# Patient Record
Sex: Female | Born: 1980 | Race: White | Hispanic: No | Marital: Married | Smoking: Never smoker
Health system: Southern US, Community
[De-identification: ages and names within clinical notes are randomized; demographics above are authoritative.]

## PROBLEM LIST (undated history)

## (undated) DIAGNOSIS — I4891 Unspecified atrial fibrillation: Secondary | ICD-10-CM

## (undated) DIAGNOSIS — I499 Cardiac arrhythmia, unspecified: Secondary | ICD-10-CM

## (undated) DIAGNOSIS — E785 Hyperlipidemia, unspecified: Secondary | ICD-10-CM

## (undated) DIAGNOSIS — G4730 Sleep apnea, unspecified: Secondary | ICD-10-CM

## (undated) DIAGNOSIS — F419 Anxiety disorder, unspecified: Secondary | ICD-10-CM

## (undated) DIAGNOSIS — T8859XA Other complications of anesthesia, initial encounter: Secondary | ICD-10-CM

## (undated) DIAGNOSIS — Z87442 Personal history of urinary calculi: Secondary | ICD-10-CM

## (undated) DIAGNOSIS — K269 Duodenal ulcer, unspecified as acute or chronic, without hemorrhage or perforation: Secondary | ICD-10-CM

## (undated) DIAGNOSIS — IMO0001 Reserved for inherently not codable concepts without codable children: Secondary | ICD-10-CM

## (undated) DIAGNOSIS — T4145XA Adverse effect of unspecified anesthetic, initial encounter: Secondary | ICD-10-CM

## (undated) DIAGNOSIS — J45909 Unspecified asthma, uncomplicated: Secondary | ICD-10-CM

## (undated) DIAGNOSIS — K2981 Duodenitis with bleeding: Secondary | ICD-10-CM

## (undated) DIAGNOSIS — E039 Hypothyroidism, unspecified: Secondary | ICD-10-CM

---

## 2003-09-09 HISTORY — PX: ROTATOR CUFF REPAIR: SHX139

## 2005-09-08 HISTORY — PX: OTHER SURGICAL HISTORY: SHX169

## 2016-02-12 ENCOUNTER — Encounter (HOSPITAL_COMMUNITY): Payer: Self-pay | Admitting: Emergency Medicine

## 2016-02-12 ENCOUNTER — Emergency Department (HOSPITAL_COMMUNITY): Payer: BLUE CROSS/BLUE SHIELD

## 2016-02-12 DIAGNOSIS — R079 Chest pain, unspecified: Secondary | ICD-10-CM | POA: Insufficient documentation

## 2016-02-12 DIAGNOSIS — R110 Nausea: Secondary | ICD-10-CM | POA: Insufficient documentation

## 2016-02-12 DIAGNOSIS — Z5321 Procedure and treatment not carried out due to patient leaving prior to being seen by health care provider: Secondary | ICD-10-CM | POA: Diagnosis not present

## 2016-02-12 LAB — BASIC METABOLIC PANEL
ANION GAP: 11 (ref 5–15)
BUN: 11 mg/dL (ref 6–20)
CALCIUM: 9.2 mg/dL (ref 8.9–10.3)
CO2: 20 mmol/L — AB (ref 22–32)
CREATININE: 1.01 mg/dL — AB (ref 0.44–1.00)
Chloride: 106 mmol/L (ref 101–111)
Glucose, Bld: 118 mg/dL — ABNORMAL HIGH (ref 65–99)
Potassium: 4.3 mmol/L (ref 3.5–5.1)
SODIUM: 137 mmol/L (ref 135–145)

## 2016-02-12 LAB — CBC
HCT: 42.2 % (ref 36.0–46.0)
HEMOGLOBIN: 14.3 g/dL (ref 12.0–15.0)
MCH: 29.4 pg (ref 26.0–34.0)
MCHC: 33.9 g/dL (ref 30.0–36.0)
MCV: 86.7 fL (ref 78.0–100.0)
PLATELETS: 207 10*3/uL (ref 150–400)
RBC: 4.87 MIL/uL (ref 3.87–5.11)
RDW: 13.3 % (ref 11.5–15.5)
WBC: 10.7 10*3/uL — AB (ref 4.0–10.5)

## 2016-02-12 LAB — I-STAT TROPONIN, ED: TROPONIN I, POC: 0 ng/mL (ref 0.00–0.08)

## 2016-02-12 NOTE — ED Notes (Signed)
The patient said she has been having ches pain since thins morning.  The patient says the pain radiates to the right side of her back.  She says she is also nauseated but not vomiting.  She denies diarrhea.   She rates her pain 9/10.

## 2016-02-13 ENCOUNTER — Emergency Department (HOSPITAL_COMMUNITY)
Admission: EM | Admit: 2016-02-13 | Discharge: 2016-02-13 | Disposition: A | Discharge status: Home / Self Care | Payer: BLUE CROSS/BLUE SHIELD | Attending: Dermatology | Admitting: Dermatology

## 2016-02-13 HISTORY — DX: Hyperlipidemia, unspecified: E78.5

## 2016-02-13 HISTORY — DX: Duodenitis with bleeding: K29.81

## 2016-02-13 HISTORY — DX: Unspecified atrial fibrillation: I48.91

## 2016-02-13 HISTORY — DX: Duodenal ulcer, unspecified as acute or chronic, without hemorrhage or perforation: K26.9

## 2016-02-13 HISTORY — DX: Unspecified asthma, uncomplicated: J45.909

## 2016-02-13 HISTORY — DX: Hypothyroidism, unspecified: E03.9

## 2016-02-13 NOTE — ED Notes (Signed)
Pt called for room, no answer.

## 2016-02-15 ENCOUNTER — Other Ambulatory Visit: Payer: Self-pay | Admitting: Otolaryngology

## 2016-02-15 DIAGNOSIS — J329 Chronic sinusitis, unspecified: Secondary | ICD-10-CM

## 2016-02-21 ENCOUNTER — Ambulatory Visit
Admission: RE | Admit: 2016-02-21 | Discharge: 2016-02-21 | Disposition: A | Discharge status: Home / Self Care | Payer: BLUE CROSS/BLUE SHIELD | Source: Ambulatory Visit | Attending: Otolaryngology | Admitting: Otolaryngology

## 2016-02-21 DIAGNOSIS — J329 Chronic sinusitis, unspecified: Secondary | ICD-10-CM

## 2016-02-26 ENCOUNTER — Other Ambulatory Visit: Payer: Self-pay | Admitting: Otolaryngology

## 2016-02-26 NOTE — H&P (Signed)
PREOPERATIVE H&P  Chief Complaint: OSA and nasal obstruction  HPI: Sara [name] is a 35 y.o. female who presents for evaluation of OSA and nasal obstruction. She's had a sleep test which OSA with RDI of 28. Lowest O2 sat to 82%. She doesn't tolerate wearing nasal CPAP which she has tried. On exam she has large tonsils and deviated septum and large turbinates. She would benefit from UPPP , septoplasty and TR. She's taken to the OR for the above procedures.  Past Medical History  Diagnosis Date  . Hyperlipidemia   . A-fib (HCC)   . Hypothyroidism   . Kidney stones   . Asthma   . Multiple duodenal ulcers    Past Surgical History  Procedure Laterality Date  . Kidney stone removal    . Rotator cuff repair     Social History   Social History  . Marital Status: Married    Spouse Name: N/A  . Number of Children: N/A  . Years of Education: N/A   Social History Main Topics  . Smoking status: Never Smoker   . Smokeless tobacco: Never Used  . Alcohol Use: No  . Drug Use: No  . Sexual Activity: Not on file   Other Topics Concern  . Not on file   Social History Narrative  . No narrative on file   No family history on file. Allergies  Allergen Reactions  . Albuterol Other (See Comments)    Tachycardia   . Codeine Rash  . Penicillins Rash   Prior to Admission medications   Medication Sig Start Date End Date Taking? Authorizing Provider  alprazolam Prudy Feeler) 2 MG tablet Take 2 mg by mouth 3 (three) times daily as needed for anxiety.   Yes Historical Provider, MD  atorvastatin (LIPITOR) 10 MG tablet Take 10 mg by mouth daily.   Yes Historical Provider, MD  Cyanocobalamin (B-12) 5000 MCG SUBL Place 1 tablet under the tongue daily.   Yes Historical Provider, MD  fluticasone (FLONASE) 50 MCG/ACT nasal spray Place 1 spray into both nostrils daily.   Yes Historical Provider, MD  levalbuterol (XOPENEX HFA) 45 MCG/ACT inhaler Inhale 1 puff into the lungs every 4 (four) hours as needed  for wheezing.   Yes Historical Provider, MD  levothyroxine (SYNTHROID, LEVOTHROID) 200 MCG tablet Take 200 mcg by mouth daily before breakfast. Pt takes 250 mcg daily   Yes Historical Provider, MD  levothyroxine (SYNTHROID, LEVOTHROID) 50 MCG tablet Take 50 mcg by mouth daily before breakfast. Pt takes 250 mcg daily   Yes Historical Provider, MD  loratadine (CLARITIN) 10 MG tablet Take 10 mg by mouth daily.   Yes Historical Provider, MD  pantoprazole (PROTONIX) 40 MG tablet Take 40 mg by mouth 2 (two) times daily.   Yes Historical Provider, MD  propranolol (INDERAL) 40 MG tablet Take 40 mg by mouth 2 (two) times daily.   Yes Historical Provider, MD  Vitamin D, Ergocalciferol, (DRISDOL) 50000 units CAPS capsule Take 50,000 Units by mouth every Monday.   Yes Historical Provider, MD     Positive ROS: per HPI  All other systems have been reviewed and were otherwise negative with the exception of those mentioned in the HPI and as above.  Physical Exam: There were no vitals filed for this visit.  General: Alert, no acute distress Oral: Normal oral mucosa and 3+ tonsils with long uvula Nasal: Clear nasal passages. Septal deviation and large turbinates Neck: No palpable adenopathy or thyroid nodules Ear: Ear canal is clear  with normal appearing TMs Cardiovascular: Regular rate and rhythm, no murmur.  Respiratory: Clear to auscultation Neurologic: Alert and oriented x 3   Assessment/Plan: NASAL OBSTRUCTION,OBSTRUCTIVE SLEEP APNEA Plan for Procedure(s): UVULOPALATOPHARYNGOPLASTY (UPPP) BILATERAL TONSILLECTOMY/SEPTOPLASTY WITH TURBINATE REDUCTION   Dillard CannonHRISTOPHER NEWMAN, MD 02/26/2016 5:43 PM

## 2016-02-27 ENCOUNTER — Encounter (HOSPITAL_COMMUNITY)
Admission: RE | Admit: 2016-02-27 | Discharge: 2016-02-27 | Disposition: A | Discharge status: Home / Self Care | Payer: BLUE CROSS/BLUE SHIELD | Source: Ambulatory Visit | Attending: Otolaryngology | Admitting: Otolaryngology

## 2016-02-27 ENCOUNTER — Encounter (HOSPITAL_COMMUNITY): Payer: Self-pay

## 2016-02-27 DIAGNOSIS — E785 Hyperlipidemia, unspecified: Secondary | ICD-10-CM | POA: Diagnosis not present

## 2016-02-27 DIAGNOSIS — J3489 Other specified disorders of nose and nasal sinuses: Secondary | ICD-10-CM | POA: Diagnosis present

## 2016-02-27 DIAGNOSIS — G4733 Obstructive sleep apnea (adult) (pediatric): Secondary | ICD-10-CM | POA: Diagnosis not present

## 2016-02-27 DIAGNOSIS — I4891 Unspecified atrial fibrillation: Secondary | ICD-10-CM | POA: Diagnosis not present

## 2016-02-27 DIAGNOSIS — E039 Hypothyroidism, unspecified: Secondary | ICD-10-CM | POA: Diagnosis not present

## 2016-02-27 DIAGNOSIS — Z880 Allergy status to penicillin: Secondary | ICD-10-CM | POA: Diagnosis not present

## 2016-02-27 HISTORY — DX: Reserved for inherently not codable concepts without codable children: IMO0001

## 2016-02-27 HISTORY — DX: Adverse effect of unspecified anesthetic, initial encounter: T41.45XA

## 2016-02-27 HISTORY — DX: Sleep apnea, unspecified: G47.30

## 2016-02-27 HISTORY — DX: Personal history of urinary calculi: Z87.442

## 2016-02-27 HISTORY — DX: Other complications of anesthesia, initial encounter: T88.59XA

## 2016-02-27 HISTORY — DX: Cardiac arrhythmia, unspecified: I49.9

## 2016-02-27 LAB — BASIC METABOLIC PANEL
Anion gap: 8 (ref 5–15)
BUN: 13 mg/dL (ref 6–20)
CHLORIDE: 107 mmol/L (ref 101–111)
CO2: 23 mmol/L (ref 22–32)
CREATININE: 0.85 mg/dL (ref 0.44–1.00)
Calcium: 9.6 mg/dL (ref 8.9–10.3)
GFR calc Af Amer: >60 mL/min (ref >60)
GFR calc non Af Amer: >60 mL/min (ref >60)
Glucose, Bld: 83 mg/dL (ref 65–99)
Potassium: 4.1 mmol/L (ref 3.5–5.1)
Sodium: 138 mmol/L (ref 135–145)

## 2016-02-27 LAB — CBC
HCT: 44.7 % (ref 36.0–46.0)
Hemoglobin: 14.9 g/dL (ref 12.0–15.0)
MCH: 29.3 pg (ref 26.0–34.0)
MCHC: 33.3 g/dL (ref 30.0–36.0)
MCV: 87.8 fL (ref 78.0–100.0)
PLATELETS: 258 10*3/uL (ref 150–400)
RBC: 5.09 MIL/uL (ref 3.87–5.11)
RDW: 13.2 % (ref 11.5–15.5)
WBC: 10.6 10*3/uL — ABNORMAL HIGH (ref 4.0–10.5)

## 2016-02-27 LAB — HCG, SERUM, QUALITATIVE: Preg, Serum: NEGATIVE

## 2016-02-27 NOTE — Progress Notes (Addendum)
Anesthesia Chart Review: Patient is a 35 year old female scheduled for UPPP, bilateral tonsillectomy, septoplasty with turbinate reduction on 02/29/16 by Dr. Dillard Cannonhristopher Newman.  History includes non-smoker, asthma, hypothyroidism, HLD, HTN, dysrhythmia (tachycardia; albuterol induced afib/SVT), OSA (no CPAP; unable to tolerate), nephrolithiasis s/p stone retrieval '07.  She reported tachycardia after anesthesia/surgery and awareness during rotator cuff and lithotripsy procedures. BMI is consistent with morbid obesity. PCP is Dr. Christell FaithJon Bullman in [place], [place].  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 She reports two instances in which she went into "afib" after receiving multiple consecutive doses of albuterol during an asthma exacerbation. The first time adenosine was unsuccessful, and she was started on a Cardizem drip and converted to SR the next day. She was evaluated by a cardiologist and reportedly had an unremarkable work-up. She thinks this was at [facility] in [place] approximately 3 years ago. She had recurrent "afib" after albuterol that did respond to adenosine. She now uses Xopenox and has not had any issues with it. Since believes she had a more recent echo within the past year at either [facility]. Dem[facility]rius [facility]P. [facility] in [place] or [facility] in [place], [place]. [facility] did not have record of a stress or echo. [facility] did have an echo and telemetry/event monitor strips). Records from [facility] requested this morning, but are still pending.    Meds include Xanax, Lipitor, Flonase, Xopenex, levothyroxine, Claritin, Protonix, Inderal, Vitamins D and B12. She was instructed to take Inderal on the day of surgery.  PAT Vitals: BP 109/48, HR 76, RR 20, T 36.4C, O2 sat 98%.  02/12/16 EKG: ST at 114 bpm, rightward axis.    09/03/14 Echo [facility]): Impression:  1. Normal left intracavity  dimension. Borderline LVH. EF > 70% with a ventricle that is noted to be hyperdynamic. 2. RV is normal size with hyperdynamic RVSP. 3. Normal LA and aortic root dimension. 5. There are no overt intracardiac masses, thrombi or vegetation or hemodynamically significant pericardial effusion.  (Telemetry/event monitor then showed SR with HR 59-132 bpm.)  06/26/15 BLE venous Duplex [facility] [facility]): Negative for bilateral DVT.  She has a previous history of what sounds like albuterol-induced SVT with reported normal work-up about three years ago. We were able to get a fairly recent echo that was unremarkable. She was not tachycardiac at PAT, but due to her history she was instructed to take Inderal on the morning of surgery. If no acute changes then I would anticipate that she could proceed as planned.    Sara Ochsllison Kanin Lia, PA-C [facility] Short Stay Center/Anesthesiology Phone [phone] 02/28/2016 5:22 PM

## 2016-02-27 NOTE — Pre-Procedure Instructions (Addendum)
Sara [name]  02/27/2016      [facility] [id number] Ginette Otto- [place], [place] - [address] AT [facility] [address] [facility] [address] [place] [id number] Phone: [phone] Fax: [phone]    Your procedure is scheduled on 02/29/16  [facility] Admitting at 530 A.M.  Call this number if you have problems the morning of surgery:  [phone]   Remember:  Do not eat food or drink liquids after midnight.  Take these medicines the morning of surgery with A SIP OF WATER inhaler if needed(bring) xanax if needed, levothyroxine, protonix, propanolol(inderal)  STOP all herbel meds, nsaids (aleve,naproxen,advil,ibuprofen)   including all vitamins, aspirin starting now   Do not wear jewelry, make-up or nail polish.  Do not wear lotions, powders, or perfumes.  You may wear deoderant.  Do not shave 48 hours prior to surgery.  Men may shave face and neck.  Do not bring valuables to the hospital.  [facility] is not responsible for any belongings or valuables.  Contacts, dentures or bridgework may not be worn into surgery.  Leave your suitcase in the car.  After surgery it may be brought to your room.  For patients admitted to the hospital, discharge time will be determined by your treatment team.  Patients discharged the day of surgery will not be allowed to drive home.   Name and phone number of your driver:    Special instructions:   Special Instructions: [facility] - Preparing for Surgery  Before surgery, you can play an important role.  Because skin is not sterile, your skin needs to be as free of germs as possible.  You can reduce the number of germs on you skin by washing with CHG (chlorahexidine gluconate) soap before surgery.  CHG is an antiseptic cleaner which kills germs and bonds with the skin to continue killing germs even after washing.  Please DO NOT use if you have an allergy to CHG or antibacterial soaps.  If your skin becomes  reddened/irritated stop using the CHG and inform your nurse when you arrive at Short Stay.  Do not shave (including legs and underarms) for at least 48 hours prior to the first CHG shower.  You may shave your face.  Please follow these instructions carefully:   1.  Shower with CHG Soap the night before surgery and the morning of Surgery.  2.  If you choose to wash your hair, wash your hair first as usual with your normal shampoo.  3.  After you shampoo, rinse your hair and body thoroughly to remove the Shampoo.  4.  Use CHG as you would any other liquid soap.  You can apply chg directly  to the skin and wash gently with scrungie or a clean washcloth.  5.  Apply the CHG Soap to your body ONLY FROM THE NECK DOWN.  Do not use on open wounds or open sores.  Avoid contact with your eyes ears, mouth and genitals (private parts).  Wash genitals (private parts)       with your normal soap.  6.  Wash thoroughly, paying special attention to the area where your surgery will be performed.  7.  Thoroughly rinse your body with warm water from the neck down.  8.  DO NOT shower/wash with your normal soap after using and rinsing off the CHG Soap.  9.  Pat yourself dry with a clean towel.  10.  Wear clean pajamas.            11.  Place clean sheets on your bed the night of your first shower and do not sleep with pets.  Day of Surgery  Do not apply any lotions/deodorants the morning of surgery.  Please wear clean clothes to the hospital/surgery center.

## 2016-02-28 ENCOUNTER — Encounter (HOSPITAL_COMMUNITY): Payer: Self-pay

## 2016-02-28 MED ORDER — CEFAZOLIN SODIUM-DEXTROSE 2-4 GM/100ML-% IV SOLN
2.0000 g | INTRAVENOUS | Status: DC
  Filled 2016-02-28: qty 100

## 2016-02-28 NOTE — Anesthesia Preprocedure Evaluation (Addendum)
Anesthesia Evaluation  Patient identified by MRN, date of birth, ID band Patient awake    Reviewed: Allergy & Precautions, NPO status , Patient's Chart, lab work & pertinent test results  Airway Mallampati: II  TM Distance: >3 FB Neck ROM: Full    Dental no notable dental hx.    Pulmonary shortness of breath, asthma , sleep apnea ,    Pulmonary exam normal breath sounds clear to auscultation       Cardiovascular Normal cardiovascular exam+ dysrhythmias Atrial Fibrillation  Rhythm:Regular Rate:Normal     Neuro/Psych negative neurological ROS  negative psych ROS   GI/Hepatic negative GI ROS, Neg liver ROS,   Endo/Other  Hypothyroidism Morbid obesity  Renal/GU negative Renal ROS     Musculoskeletal negative musculoskeletal ROS (+)   Abdominal (+) + obese,   Peds  Hematology negative hematology ROS (+)   Anesthesia Other Findings   Reproductive/Obstetrics negative OB ROS                            Anesthesia Physical Anesthesia Plan  ASA: III  Anesthesia Plan: General   Post-op Pain Management:    Induction: Intravenous  Airway Management Planned: Oral ETT  Additional Equipment:   Intra-op Plan:   Post-operative Plan: Extubation in OR  Informed Consent: I have reviewed the patients History and Physical, chart, labs and discussed the procedure including the risks, benefits and alternatives for the proposed anesthesia with the patient or authorized representative who has indicated his/her understanding and acceptance.   Dental advisory given  Plan Discussed with: CRNA  Anesthesia Plan Comments:         Anesthesia Quick Evaluation

## 2016-02-29 ENCOUNTER — Observation Stay (HOSPITAL_COMMUNITY)
Admission: RE | Admit: 2016-02-29 | Discharge: 2016-03-01 | Disposition: A | Discharge status: Home / Self Care | Payer: BLUE CROSS/BLUE SHIELD | Source: Ambulatory Visit | Attending: Otolaryngology | Admitting: Otolaryngology

## 2016-02-29 ENCOUNTER — Encounter (HOSPITAL_COMMUNITY): Payer: Self-pay | Admitting: General Practice

## 2016-02-29 ENCOUNTER — Ambulatory Visit (HOSPITAL_COMMUNITY): Payer: BLUE CROSS/BLUE SHIELD | Admitting: Certified Registered Nurse Anesthetist

## 2016-02-29 ENCOUNTER — Ambulatory Visit (HOSPITAL_COMMUNITY): Payer: BLUE CROSS/BLUE SHIELD | Admitting: Vascular Surgery

## 2016-02-29 ENCOUNTER — Encounter (HOSPITAL_COMMUNITY)
Admission: RE | Disposition: A | Discharge status: Home / Self Care | Payer: Self-pay | Source: Ambulatory Visit | Attending: Otolaryngology

## 2016-02-29 DIAGNOSIS — E039 Hypothyroidism, unspecified: Secondary | ICD-10-CM | POA: Insufficient documentation

## 2016-02-29 DIAGNOSIS — Z880 Allergy status to penicillin: Secondary | ICD-10-CM | POA: Insufficient documentation

## 2016-02-29 DIAGNOSIS — G4733 Obstructive sleep apnea (adult) (pediatric): Secondary | ICD-10-CM | POA: Diagnosis present

## 2016-02-29 DIAGNOSIS — E785 Hyperlipidemia, unspecified: Secondary | ICD-10-CM | POA: Insufficient documentation

## 2016-02-29 DIAGNOSIS — J3489 Other specified disorders of nose and nasal sinuses: Principal | ICD-10-CM | POA: Insufficient documentation

## 2016-02-29 DIAGNOSIS — I4891 Unspecified atrial fibrillation: Secondary | ICD-10-CM | POA: Insufficient documentation

## 2016-02-29 HISTORY — PX: UVULECTOMY: SHX2631

## 2016-02-29 HISTORY — DX: Anxiety disorder, unspecified: F41.9

## 2016-02-29 HISTORY — PX: UVULOPALATOPHARYNGOPLASTY (UPPP)/TONSILLECTOMY/SEPTOPLASTY: SHX6164

## 2016-02-29 SURGERY — SEPTOPLASTY, NOSE, WITH TONSILLECTOMY AND UPPP
Anesthesia: General | Site: Mouth | Laterality: Bilateral

## 2016-02-29 SURGERY — Surgical Case
Anesthesia: *Unknown

## 2016-02-29 MED ORDER — HYDROMORPHONE HCL 1 MG/ML IJ SOLN
0.2500 mg | INTRAMUSCULAR | Status: DC | PRN
  Administered 2016-02-29 (×4): 0.5 mg via INTRAVENOUS

## 2016-02-29 MED ORDER — OXYMETAZOLINE HCL 0.05 % NA SOLN
NASAL | Status: AC
  Filled 2016-02-29: qty 15

## 2016-02-29 MED ORDER — SCOPOLAMINE 1 MG/3DAYS TD PT72
MEDICATED_PATCH | TRANSDERMAL | Status: AC
  Filled 2016-02-29: qty 1

## 2016-02-29 MED ORDER — ROCURONIUM BROMIDE 50 MG/5ML IV SOLN
INTRAVENOUS | Status: AC
  Filled 2016-02-29: qty 1

## 2016-02-29 MED ORDER — MIDAZOLAM HCL 5 MG/5ML IJ SOLN
INTRAMUSCULAR | Status: DC | PRN
  Administered 2016-02-29: 2 mg via INTRAVENOUS

## 2016-02-29 MED ORDER — LEVOTHYROXINE SODIUM 100 MCG PO TABS: 200.0000 ug | ORAL_TABLET | Freq: Every day | ORAL | Status: DC

## 2016-02-29 MED ORDER — HYDROMORPHONE HCL 1 MG/ML IJ SOLN
1.0000 mg | INTRAMUSCULAR | Status: DC | PRN
  Administered 2016-02-29 – 2016-03-01 (×2): 1 mg via INTRAVENOUS
  Filled 2016-02-29 (×3): qty 1

## 2016-02-29 MED ORDER — LEVOTHYROXINE SODIUM 50 MCG PO TABS: 50.0000 ug | ORAL_TABLET | Freq: Every day | ORAL | Status: DC

## 2016-02-29 MED ORDER — HYDROCODONE-ACETAMINOPHEN 7.5-325 MG/15ML PO SOLN
10.0000 mL | ORAL | Status: DC | PRN
  Administered 2016-02-29 – 2016-03-01 (×3): 15 mL via ORAL
  Filled 2016-02-29 (×6): qty 15

## 2016-02-29 MED ORDER — DEXAMETHASONE SODIUM PHOSPHATE 10 MG/ML IJ SOLN
INTRAMUSCULAR | Status: DC | PRN
  Administered 2016-02-29: 10 mg via INTRAVENOUS

## 2016-02-29 MED ORDER — FENTANYL CITRATE (PF) 250 MCG/5ML IJ SOLN
INTRAMUSCULAR | Status: AC
  Filled 2016-02-29: qty 5

## 2016-02-29 MED ORDER — LEVALBUTEROL TARTRATE 45 MCG/ACT IN AERO
1.0000 | INHALATION_SPRAY | RESPIRATORY_TRACT | Status: DC
  Filled 2016-02-29: qty 15

## 2016-02-29 MED ORDER — PROPOFOL 10 MG/ML IV BOLUS
INTRAVENOUS | Status: AC
  Filled 2016-02-29: qty 20

## 2016-02-29 MED ORDER — BACITRACIN ZINC 500 UNIT/GM EX OINT
TOPICAL_OINTMENT | CUTANEOUS | Status: AC
  Filled 2016-02-29: qty 28.35

## 2016-02-29 MED ORDER — ROCURONIUM BROMIDE 100 MG/10ML IV SOLN
INTRAVENOUS | Status: DC | PRN
  Administered 2016-02-29 (×2): 10 mg via INTRAVENOUS
  Administered 2016-02-29: 40 mg via INTRAVENOUS

## 2016-02-29 MED ORDER — MIDAZOLAM HCL 2 MG/2ML IJ SOLN
INTRAMUSCULAR | Status: AC
  Filled 2016-02-29: qty 2

## 2016-02-29 MED ORDER — PHENOL 1.4 % MT LIQD
1.0000 | OROMUCOSAL | Status: DC | PRN
Start: 2016-02-29 — End: 2016-03-01
  Administered 2016-02-29: 1 via OROMUCOSAL
  Filled 2016-02-29 (×2): qty 177

## 2016-02-29 MED ORDER — ALPRAZOLAM 0.5 MG PO TABS
2.0000 mg | ORAL_TABLET | Freq: Three times a day (TID) | ORAL | Status: DC | PRN
Start: 2016-02-29 — End: 2016-03-01
  Administered 2016-03-01: 2 mg via ORAL
  Filled 2016-02-29: qty 4

## 2016-02-29 MED ORDER — ONDANSETRON HCL 4 MG/2ML IJ SOLN
4.0000 mg | Freq: Four times a day (QID) | INTRAMUSCULAR | Status: DC | PRN
  Administered 2016-02-29 – 2016-03-01 (×3): 4 mg via INTRAVENOUS
  Filled 2016-02-29 (×4): qty 2

## 2016-02-29 MED ORDER — GLYCOPYRROLATE 0.2 MG/ML IJ SOLN
INTRAMUSCULAR | Status: DC | PRN
  Administered 2016-02-29: 0.2 mg via INTRAVENOUS

## 2016-02-29 MED ORDER — MEPERIDINE HCL 25 MG/ML IJ SOLN: 6.2500 mg | INTRAMUSCULAR | Status: DC | PRN

## 2016-02-29 MED ORDER — OXYCODONE HCL 5 MG/5ML PO SOLN
5.0000 mg | ORAL | Status: DC | PRN
  Administered 2016-02-29: 5 mg via ORAL
  Filled 2016-02-29: qty 5

## 2016-02-29 MED ORDER — GLYCOPYRROLATE 0.2 MG/ML IV SOSY
PREFILLED_SYRINGE | INTRAVENOUS | Status: AC
  Filled 2016-02-29: qty 3

## 2016-02-29 MED ORDER — CEFAZOLIN IN D5W 1 GM/50ML IV SOLN
1.0000 g | Freq: Three times a day (TID) | INTRAVENOUS | Status: DC
  Administered 2016-02-29 – 2016-03-01 (×3): 1 g via INTRAVENOUS
  Filled 2016-02-29 (×4): qty 50

## 2016-02-29 MED ORDER — ONDANSETRON HCL 4 MG/2ML IJ SOLN
INTRAMUSCULAR | Status: DC | PRN
  Administered 2016-02-29: 4 mg via INTRAVENOUS

## 2016-02-29 MED ORDER — ONDANSETRON HCL 4 MG/2ML IJ SOLN: 4.0000 mg | Freq: Four times a day (QID) | INTRAMUSCULAR | Status: DC

## 2016-02-29 MED ORDER — PROPRANOLOL HCL 40 MG PO TABS
40.0000 mg | ORAL_TABLET | Freq: Two times a day (BID) | ORAL | Status: DC
  Filled 2016-02-29 (×3): qty 1

## 2016-02-29 MED ORDER — LIDOCAINE-EPINEPHRINE 1 %-1:100000 IJ SOLN
INTRAMUSCULAR | Status: AC
  Filled 2016-02-29: qty 1

## 2016-02-29 MED ORDER — FENTANYL CITRATE (PF) 100 MCG/2ML IJ SOLN
INTRAMUSCULAR | Status: DC | PRN
  Administered 2016-02-29 (×5): 50 ug via INTRAVENOUS

## 2016-02-29 MED ORDER — LIDOCAINE 2% (20 MG/ML) 5 ML SYRINGE
INTRAMUSCULAR | Status: AC
  Filled 2016-02-29: qty 5

## 2016-02-29 MED ORDER — IBUPROFEN 100 MG/5ML PO SUSP
400.0000 mg | Freq: Four times a day (QID) | ORAL | Status: DC | PRN
  Filled 2016-02-29: qty 20

## 2016-02-29 MED ORDER — HYDROMORPHONE HCL 1 MG/ML IJ SOLN
INTRAMUSCULAR | Status: AC
  Filled 2016-02-29: qty 1

## 2016-02-29 MED ORDER — LEVALBUTEROL HCL 0.63 MG/3ML IN NEBU: 0.6300 mg | INHALATION_SOLUTION | RESPIRATORY_TRACT | Status: DC | PRN

## 2016-02-29 MED ORDER — LIDOCAINE HCL (CARDIAC) 20 MG/ML IV SOLN
INTRAVENOUS | Status: DC | PRN
  Administered 2016-02-29: 100 mg via INTRAVENOUS

## 2016-02-29 MED ORDER — LIDOCAINE-EPINEPHRINE 1 %-1:100000 IJ SOLN
INTRAMUSCULAR | Status: DC | PRN
  Administered 2016-02-29: 20 mL

## 2016-02-29 MED ORDER — CEFAZOLIN SODIUM-DEXTROSE 2-3 GM-% IV SOLR
INTRAVENOUS | Status: DC | PRN
  Administered 2016-02-29: 2 g via INTRAVENOUS

## 2016-02-29 MED ORDER — SCOPOLAMINE 1 MG/3DAYS TD PT72
1.0000 | MEDICATED_PATCH | TRANSDERMAL | Status: DC
  Administered 2016-02-29: 1.5 mg via TRANSDERMAL

## 2016-02-29 MED ORDER — PROMETHAZINE HCL 25 MG/ML IJ SOLN
INTRAMUSCULAR | Status: AC
  Filled 2016-02-29: qty 1

## 2016-02-29 MED ORDER — HYDROCODONE-ACETAMINOPHEN 7.5-325 MG/15ML PO SOLN
ORAL | Status: AC
  Filled 2016-02-29: qty 15

## 2016-02-29 MED ORDER — PROMETHAZINE HCL 25 MG/ML IJ SOLN
6.2500 mg | INTRAMUSCULAR | Status: AC | PRN
Start: 2016-02-29 — End: 2016-02-29
  Administered 2016-02-29: 12.5 mg via INTRAVENOUS
  Administered 2016-02-29: 6.25 mg via INTRAVENOUS

## 2016-02-29 MED ORDER — OXYMETAZOLINE HCL 0.05 % NA SOLN
NASAL | Status: DC | PRN
  Administered 2016-02-29: 1 via TOPICAL

## 2016-02-29 MED ORDER — SUGAMMADEX SODIUM 500 MG/5ML IV SOLN
INTRAVENOUS | Status: DC | PRN
  Administered 2016-02-29: 200 mg via INTRAVENOUS

## 2016-02-29 MED ORDER — PROPOFOL 10 MG/ML IV BOLUS
INTRAVENOUS | Status: DC | PRN
  Administered 2016-02-29: 200 mg via INTRAVENOUS

## 2016-02-29 MED ORDER — LACTATED RINGERS IV SOLN
INTRAVENOUS | Status: DC | PRN
  Administered 2016-02-29 (×2): via INTRAVENOUS

## 2016-02-29 MED ORDER — MORPHINE SULFATE (PF) 2 MG/ML IV SOLN
INTRAVENOUS | Status: AC
  Filled 2016-02-29: qty 1

## 2016-02-29 MED ORDER — MORPHINE SULFATE (PF) 2 MG/ML IV SOLN
2.0000 mg | INTRAVENOUS | Status: DC | PRN
  Administered 2016-02-29 (×3): 2 mg via INTRAVENOUS
  Filled 2016-02-29 (×2): qty 1

## 2016-02-29 SURGICAL SUPPLY — 54 items
BLADE 10 SAFETY STRL DISP (BLADE) IMPLANT
BLADE INF TURB ROT M4 2 5PK (BLADE) ×2 IMPLANT
BLADE INF TURB ROT M4 2MM 5PK (BLADE) ×1
BLADE SURG 15 STRL LF DISP TIS (BLADE) IMPLANT
BLADE SURG 15 STRL SS (BLADE)
CANISTER SUCTION 2500CC (MISCELLANEOUS) ×3 IMPLANT
CLEANER TIP ELECTROSURG 2X2 (MISCELLANEOUS) ×3 IMPLANT
CLOSURE WOUND 1/2 X4 (GAUZE/BANDAGES/DRESSINGS)
COAGULATOR SUCT 8FR VV (MISCELLANEOUS) ×3 IMPLANT
DRAPE PROXIMA HALF (DRAPES) ×3 IMPLANT
DRESSING NASAL KENNEDY 3.5X.9 (MISCELLANEOUS) IMPLANT
DRESSING TELFA 8X3 (GAUZE/BANDAGES/DRESSINGS) IMPLANT
DRSG NASAL KENNEDY 3.5X.9 (MISCELLANEOUS)
DRSG TELFA 3X8 NADH (GAUZE/BANDAGES/DRESSINGS) ×3 IMPLANT
ELECT COATED BLADE 2.86 ST (ELECTRODE) ×3 IMPLANT
ELECT REM PT RETURN 9FT ADLT (ELECTROSURGICAL) ×3
ELECTRODE REM PT RTRN 9FT ADLT (ELECTROSURGICAL) ×1 IMPLANT
GLOVE BIOGEL PI IND STRL 7.0 (GLOVE) ×1 IMPLANT
GLOVE BIOGEL PI INDICATOR 7.0 (GLOVE) ×2
GLOVE SS BIOGEL STRL SZ 7.5 (GLOVE) ×1 IMPLANT
GLOVE SUPERSENSE BIOGEL SZ 7.5 (GLOVE) ×2
GLOVE SURG SS PI 7.0 STRL IVOR (GLOVE) ×3 IMPLANT
GOWN STRL REUS W/ TWL LRG LVL3 (GOWN DISPOSABLE) ×2 IMPLANT
GOWN STRL REUS W/ TWL XL LVL3 (GOWN DISPOSABLE) ×1 IMPLANT
GOWN STRL REUS W/TWL LRG LVL3 (GOWN DISPOSABLE) ×4
GOWN STRL REUS W/TWL XL LVL3 (GOWN DISPOSABLE) ×2
KIT BASIN OR (CUSTOM PROCEDURE TRAY) ×3 IMPLANT
KIT ROOM TURNOVER OR (KITS) ×3 IMPLANT
NEEDLE HYPO 25GX1X1/2 BEV (NEEDLE) ×3 IMPLANT
NEEDLE HYPO 25X1 1.5 SAFETY (NEEDLE) IMPLANT
NS IRRIG 1000ML POUR BTL (IV SOLUTION) ×3 IMPLANT
PAD ARMBOARD 7.5X6 YLW CONV (MISCELLANEOUS) ×6 IMPLANT
PATTIES SURGICAL .5 X3 (DISPOSABLE) ×3 IMPLANT
PENCIL FOOT CONTROL (ELECTRODE) ×3 IMPLANT
SPECIMEN JAR SMALL (MISCELLANEOUS) ×3 IMPLANT
SPLINT NASAL DOYLE BI-VL (GAUZE/BANDAGES/DRESSINGS) IMPLANT
SPONGE INTESTINAL PEANUT (DISPOSABLE) ×3 IMPLANT
SPONGE TONSIL 1 RF SGL (DISPOSABLE) IMPLANT
STRIP CLOSURE SKIN 1/2X4 (GAUZE/BANDAGES/DRESSINGS) IMPLANT
SUT CHROMIC 3 0 PS 2 (SUTURE) ×3 IMPLANT
SUT CHROMIC 4 0 PS 5 (SUTURE) ×3 IMPLANT
SUT CHROMIC 5 0 P 3 (SUTURE) IMPLANT
SUT ETHILON 3 0 PS 1 (SUTURE) IMPLANT
SUT ETHILON 4 0 PS 2 18 (SUTURE) IMPLANT
SUT ETHILON 5 0 P 3 18 (SUTURE)
SUT NYLON ETHILON 5-0 P-3 1X18 (SUTURE) IMPLANT
SUT SILK 2 0 FS (SUTURE) ×3 IMPLANT
SUT VIC AB 3-0 SH 27 (SUTURE)
SUT VIC AB 3-0 SH 27XBRD (SUTURE) IMPLANT
SYR 3ML 25GX5/8 SAFETY (SYRINGE) IMPLANT
TOWEL OR 17X24 6PK STRL BLUE (TOWEL DISPOSABLE) ×3 IMPLANT
TRAY ENT MC OR (CUSTOM PROCEDURE TRAY) ×3 IMPLANT
TUBE SALEM SUMP 12R W/ARV (TUBING) IMPLANT
WATER STERILE IRR 1000ML POUR (IV SOLUTION) IMPLANT

## 2016-02-29 NOTE — Anesthesia Procedure Notes (Signed)
Procedure Name: Intubation Date/Time: 02/29/2016 7:41 AM Performed by: Adonis HousekeeperNGELL, Athelene Hursey M Pre-anesthesia Checklist: Patient identified, Emergency Drugs available, Suction available and Patient being monitored Patient Re-evaluated:Patient Re-evaluated prior to inductionOxygen Delivery Method: Circle system utilized Preoxygenation: Pre-oxygenation with 100% oxygen Intubation Type: IV induction Ventilation: Mask ventilation without difficulty Laryngoscope Size: Mac and 3 Grade View: Grade I Tube type: Oral Tube size: 7.0 mm Number of attempts: 1 Airway Equipment and Method: Stylet Placement Confirmation: positive ETCO2,  breath sounds checked- equal and bilateral and ETT inserted through vocal cords under direct vision Secured at: 21 cm Tube secured with: Tape Dental Injury: Teeth and Oropharynx as per pre-operative assessment

## 2016-02-29 NOTE — Transfer of Care (Signed)
Immediate Anesthesia Transfer of Care Note  Patient: Sara [name]  Procedure(s) Performed: Procedure(s): UVULOPALATOPHARYNGOPLASTY (UPPP) BILATERAL TONSILLECTOMY/SEPTOPLASTY WITH TURBINATE REDUCTION (Bilateral)  Patient Location: PACU  Anesthesia Type:General  Level of Consciousness: awake, alert  and oriented  Airway & Oxygen Therapy: Patient Spontanous Breathing  Post-op Assessment: Report given to RN, Post -op Vital signs reviewed and stable and Patient moving all extremities X 4  Post vital signs: Reviewed  Last Vitals:  Filed Vitals:   02/29/16 0700 02/29/16 0948  BP: 124/93 117/78  Pulse: 77 92  Temp: 36.9 C 36.6 C  Resp: 20 19    Last Pain:  Filed Vitals:   02/29/16 0949  PainSc: 3       Patients Stated Pain Goal: 4 (02/29/16 0657)  Complications: No apparent anesthesia complications

## 2016-02-29 NOTE — Progress Notes (Signed)
Post Op check Awake alert complains of pain. States morphine doesn't help request Dilaudid. Airway stable . O2 sat 95% No bleeding Takes sips with trouble Stable post op

## 2016-02-29 NOTE — Anesthesia Postprocedure Evaluation (Signed)
Anesthesia Post Note  Patient: Tomie Chinashley N [place]land  Procedure(s) Performed: Procedure(s) (LRB): UVULOPALATOPHARYNGOPLASTY (UPPP) BILATERAL TONSILLECTOMY/SEPTOPLASTY WITH TURBINATE REDUCTION (Bilateral)  Patient location during evaluation: PACU Anesthesia Type: General Level of consciousness: sedated and patient cooperative Pain management: pain level controlled Vital Signs Assessment: post-procedure vital signs reviewed and stable Respiratory status: spontaneous breathing Cardiovascular status: stable Anesthetic complications: no    Last Vitals:  Filed Vitals:   02/29/16 1315 02/29/16 1345  BP: 106/70 118/77  Pulse: 69 94  Temp:    Resp: 14 15    Last Pain:  Filed Vitals:   02/29/16 1407  PainSc: Asleep                 Lewie LoronJohn Kesleigh Morson

## 2016-02-29 NOTE — Brief Op Note (Signed)
02/29/2016  9:37 AM  PATIENT:  Sara [name]  35 y.o. female  PRE-OPERATIVE DIAGNOSIS:  NASAL OBSTRUCTION,OBSTRUCTIVE SLEEP APNEA  POST-OPERATIVE DIAGNOSIS:  NASAL OBSTRUCTION,OBSTRUCTIVE SLEEP APNEA  PROCEDURE:  Procedure(s): UVULOPALATOPHARYNGOPLASTY (UPPP) BILATERAL TONSILLECTOMY/SEPTOPLASTY WITH TURBINATE REDUCTION (Bilateral)  SURGEON:  Surgeon(s) and Role:    * Drema Halonhristopher E Jaidynn Balster, MD - Primary  PHYSICIAN ASSISTANT:   ASSISTANTS: none   ANESTHESIA:   general  EBL:  Total I/O In: 1000 [I.V.:1000] Out: -   BLOOD ADMINISTERED:none  DRAINS: none   LOCAL MEDICATIONS USED:  LIDOCAINE with EPI 12 cc  SPECIMEN:  No Specimen  DISPOSITION OF SPECIMEN:  N/A  COUNTS:  YES  TOURNIQUET:  * No tourniquets in log *  DICTATION: .Other Dictation: Dictation Number [id number]  PLAN OF CARE: Admit for overnight observation  PATIENT DISPOSITION:  PACU - hemodynamically stable.   Delay start of Pharmacological VTE agent (>24hrs) due to surgical blood loss or risk of bleeding: yes

## 2016-02-29 NOTE — Interval H&P Note (Signed)
History and Physical Interval Note:  02/29/2016 7:24 AM  Sara [name]  has presented today for surgery, with the diagnosis of NASAL OBSTRUCTION,OBSTRUCTIVE SLEEP APNEA  The various methods of treatment have been discussed with the patient and family. After consideration of risks, benefits and other options for treatment, the patient has consented to  Procedure(s): UVULOPALATOPHARYNGOPLASTY (UPPP) BILATERAL TONSILLECTOMY/SEPTOPLASTY WITH TURBINATE REDUCTION (Bilateral) as a surgical intervention .  The patient's history has been reviewed, patient examined, no change in status, stable for surgery.  I have reviewed the patient's chart and labs.  Questions were answered to the patient's satisfaction.     CHRISTOPHER NEWMAN

## 2016-02-29 NOTE — Progress Notes (Signed)
RT Note: Patient was placed on a face tent for aerosolized oxygen per MD order. Patient was wearing a nasal cannula at 1lpm in her mouth prior to my initiating the face tent. She was placed on a face tent at 28% which will be weaned as tolerated. Patient is stable and is in no apparent respiratory distress. RT will continue to monitor.

## 2016-02-29 NOTE — Op Note (Signed)
NAMAntony [name]:  Sara [name]                  ACCOUNT NO.:  [id number]  MEDICAL RECORD NO.:  [id number]  LOCATION:  [facility]                         FACILITY:  [facility]  PHYSICIAN:  Kristine GarbeChristopher E. Ezzard [name], M.D.DATE OF BIRTH:  09/06/1981  DATE OF PROCEDURE:  02/29/2016 DATE OF DISCHARGE:                              OPERATIVE REPORT   PREOPERATIVE DIAGNOSES: 1. 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 2. Obstructive sleep apnea with tonsillar hypertrophy.  POSTOPERATIVE DIAGNOSES: 1. 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 2. Obstructive sleep apnea with tonsillar hypertrophy.  OPERATION PERFORMED:  Septoplasty with bilateral inferior turbinate reductions with Medtronic turbinate blade.  Uvulopalatopharyngoplasty with tonsillectomy.  SURGEON:  Kristine GarbeChristopher E. Ezzard [name], M.D.  ANESTHESIA:  General endotracheal.  COMPLICATIONS:  None.  BRIEF CLINICAL NOTE:  Sara [name] is a 35 year old female who has had chronic issues with nasal obstruction, trouble breathing through her nose, as well as history of sinus infection; however, on recent CT scan this showed clear paranasal sinuses, she did have a septal deformity to the right with a large right septal spur and turbinate hypertrophy, but sinuses were clear with no obstruction, mucosal thickening, or opacification.  She also has sleep apnea with RDI of 25, but she does not tolerate nasal CPAP well and does not wear nasal CPAP.  She is taken to the operating room at this time for septoplasty and turbinate reduction to help improve her nasal breathing and uvulopalatopharyngoplasty to help improve her obstructive sleep apnea.  DESCRIPTION OF PROCEDURE:  After adequate endotracheal anesthesia, the patient received 2 g of Ancef IV preoperatively as well as 10 mg Decadron.  First, the nose was prepped with Betadine solution and draped out with sterile towels and the  septoplasty and turbinate reductions were performed first.  The septum was injected with Xylocaine with epinephrine for hemostasis as was the inferior turbinates.  A hemitransfixion incision was made along the septum on the right side. Mucoperichondrial and mucoperiosteal flaps were elevated.  The septum bowed mostly to the right at the junction of the cartilaginous bony septum.  A vertical incision was made through the cartilaginous septum just anterior to the vomer and mucoperichondrial and mucoperiosteal flaps were elevated posterior to this on either side.  The portion of the bony cartilage septum bowed to the right was removed in addition to a large right septal spur.  This completed the septoplasty portion of the procedure.  Next, the inferior turbinate reductions were performed with the Medtronic turbinate blade on the right side and the left side where she had a little bit more protruding bone, some of the bone from the left inferior turbinate was removed after making a small incision removing turbinate bone, the Medtronic turbinate blade was used to reduce further turbinate soft tissue in the posterior turbinate.  The turbinates were then outfractured.  Suction cautery was used for hemostasis.  The hemitransfixion incision was closed with interrupted 4- 0 chromic sutures.  The septum was basted with a 3-0 chromic suture. The nose was packed with Telfa soaked in bacitracin ointment, placed in either side of the  septum.  Following this, the patient was turned, a mouth gag was used to expose the oropharynx.  Using the cautery, the left and right tonsils were recessed from tonsillar fossas up to the uvula.  The uvula was then transected about at its base of attachment to the soft palate.  Hemostasis was obtained with cautery.  The palate mucosa was reapproximated in either side of the uvula with interrupted 3- 0 chromic sutures.  There was no significant bleeding.  This completed the  procedure.  Nasogastric tube was passed to the stomach and the patient was awakened from anesthesia and transferred to the recovery room postoperatively doing well.  DISPOSITION:  The patient will be observed overnight in the step-down unit because of history of sleep apnea.  We will plan on removing her nasal packing tomorrow and discharge her home on Zithromax suspension for 6 days and hydrocodone elixir 10 to 15 mL q.4 hours p.r.n. pain.  I will have her follow up in my office in 10 days for recheck.          ______________________________ Kristine Garbehristopher E. Ezzard [name], M.D.     CEN/MEDQ  D:  02/29/2016  T:  02/29/2016  Job:  [id number]  cc:   Kristine Garbehristopher E. Ezzard [name], M.D.

## 2016-02-29 NOTE — Progress Notes (Signed)
02/29/2016 patient came from pacu to  2 central at 1600. She is alert, oriented and ambulatory. Patient seems little drowsy, but is arousable and able to answer questions. She had nasal surgery, tonsilectomy, surgery site clean and dry. Patient received CHG bath, did not get MRSA swab, because of  nasal surgery. Patient skin intact have tattoos on bilateral arms, chest abdominal foles, right leg. [facility] Matther Labell RN.

## 2016-03-01 DIAGNOSIS — J3489 Other specified disorders of nose and nasal sinuses: Secondary | ICD-10-CM | POA: Diagnosis not present

## 2016-03-01 MED ORDER — HYDROCODONE-ACETAMINOPHEN 7.5-325 MG/15ML PO SOLN: 10.0000 mL | ORAL | Status: AC | PRN

## 2016-03-01 MED ORDER — ONDANSETRON 8 MG PO TBDP: 8.0000 mg | ORAL_TABLET | Freq: Three times a day (TID) | ORAL | Status: AC | PRN

## 2016-03-01 MED ORDER — OXYCODONE HCL 5 MG/5ML PO SOLN: 5.0000 mg | ORAL | Status: AC | PRN

## 2016-03-01 MED ORDER — SODIUM CHLORIDE 0.45 % IV BOLUS: 250.0000 mL | Freq: Once | INTRAVENOUS | Status: DC

## 2016-03-01 MED ORDER — AZITHROMYCIN 200 MG/5ML PO SUSR: 200.0000 mg | Freq: Every day | ORAL | Status: AC

## 2016-03-01 MED ORDER — SODIUM CHLORIDE 0.9 % IV BOLUS (SEPSIS)
250.0000 mL | Freq: Once | INTRAVENOUS | Status: AC
Start: 2016-03-01 — End: 2016-03-01
  Administered 2016-03-01: 250 mL via INTRAVENOUS

## 2016-03-01 NOTE — Discharge Instructions (Addendum)
Instructions for Home Care After Tonsillectomy  First Day Home: Encourage fluid intake by frequently offering liquids, soup, ice cream jello, etc.  Drink several glasses of water.  Cooler fluids are best.  Avoid hot and highly seasoned foods.  Orange juice, grapefruit juice and tomato juice may cause stinging sensation because of their acidic content.    Second and Third Day Home: Continue liquids and add soft foods, (pudding, macaroni and cheese, mashed potatoes, soft scrambled eggs, etc.).  Make sure you drink plenty of liquids so you do not get dehydrated.  Fifth Thru Seventh Day Home: Gradually resume a normal diet, but avoid hot foods, potato chips, nuts, toast and crackers until 2 weeks after surgery.  General Instructions   No undue physical exertion or exercise for one week.  Children: Tylenol may be used for discomfort and/or fever.  Use as often as necessary within limits of the directions.  Adults: May spray throat with Chloroseptic or other topical anesthetic for discomfort and use pain medication obtained by prescription as directed.    A slight fever (up to 101) is expected for the first the first couple of days.  Take Tylenol (or aspirin substitute) as directed.  Pain in ears is common after tonsillectomy.  It represents pain referred from the throat where the tonsils were removed.  There is usually nothing wrong with the ears in most cases.  Administer Tylenol as needed to control this pain.  White patches will form where the tonsils were removed.  This is perfectly normal.  They will disappear in one to two weeks.  Mouth odor may be notice during the healing stages.  Do not use aspirin for two weeks; it increases the possibility of bleeding.  In a very small percentage of people, there is some bleeding after five to six days.  If this happens, do not become excited, for the bleeding is usually light.  Be quiet, lie down, and spit the blood out gently.  Gargle the throat  with ice water.  If the bleeding does not stop promptly, call the office [phone]), which answers 24 hours a day.  A follow up appointment should be made with Dr. Ezzard StandingNewman 10-14 days following surgery. Please call [phone] for the appointment time.  Use saline rinse for your nose daily to keep it clear Tylenol or motrin to assist in pain control Use either hydrocodone elixir 2-3 tsp (10-15 cc) or oxycodone 1 tsp (5cc) for more severe pain control Zithromax 5 cc daily for the next 6 days and zofran tabs prn nausea Drink plenty of liquids  Notify Dr Ezzard StandingNewman if you have any bleeding problems   [phone]

## 2016-03-01 NOTE — Progress Notes (Signed)
06/01/2016 Patient received d/c instruction at 1046. Rn went over medication which need to be taken when she gets home. RN read education /instruction on home care after Tonsillectomy and when to notify Dr Ezzard StandingNewman (ENT). Patient continue to be alert and oriented and ambulatory. No drainage noted from surgical site. Vital signs at 1057: 105/80, HR 61, O2 sat 97, RR 12 and temp 97.8 orally. Patient was offer her morning medication Synthroid patient stated normally take medication at night, she refuse he propranolol. Patient was given her schedule Ancef antibiotic, per pharmacy ok to give. [facility] Allante Whitmire RN.

## 2016-03-01 NOTE — Progress Notes (Signed)
POD 1 VSS O2 sats 95 Nasal packing removed this am with minimal bleeding Drinking liquids OK according to nursing staff but complains of pain Discharge home this am on hydrocodone elixir, oxycodone elixir and zithromax Follow up my office in 10-12 days

## 2016-03-01 NOTE — Progress Notes (Signed)
03/01/2016 Patient told RN at 1110 about her Xopenox, call [facility], OR, Pharmacy stated they did not have medication in the department. [facility] nurse, the pre-op nurse that had the patient was not present today. The nurse in Short Stay today look at the computer found the nurse that had receive the patient Yesterday I was able to talk to her on the phone, per Mardella LaymanLindsey patient did not bring any medication with her. [facility] Kiyaan Haq RN.

## 2016-03-03 ENCOUNTER — Emergency Department (HOSPITAL_COMMUNITY)
Admission: EM | Admit: 2016-03-03 | Discharge: 2016-03-04 | Disposition: A | Discharge status: Home / Self Care | Payer: BLUE CROSS/BLUE SHIELD | Attending: Emergency Medicine | Admitting: Emergency Medicine

## 2016-03-03 ENCOUNTER — Encounter (HOSPITAL_COMMUNITY): Payer: Self-pay | Admitting: Otolaryngology

## 2016-03-03 DIAGNOSIS — Z79899 Other long term (current) drug therapy: Secondary | ICD-10-CM | POA: Diagnosis not present

## 2016-03-03 DIAGNOSIS — R070 Pain in throat: Secondary | ICD-10-CM | POA: Diagnosis not present

## 2016-03-03 DIAGNOSIS — R000 Tachycardia, unspecified: Secondary | ICD-10-CM | POA: Insufficient documentation

## 2016-03-03 DIAGNOSIS — G8918 Other acute postprocedural pain: Secondary | ICD-10-CM | POA: Insufficient documentation

## 2016-03-03 DIAGNOSIS — D72829 Elevated white blood cell count, unspecified: Secondary | ICD-10-CM | POA: Diagnosis not present

## 2016-03-03 DIAGNOSIS — J45909 Unspecified asthma, uncomplicated: Secondary | ICD-10-CM | POA: Diagnosis not present

## 2016-03-03 LAB — BASIC METABOLIC PANEL
Anion gap: 9 (ref 5–15)
BUN: 9 mg/dL (ref 6–20)
CALCIUM: 9.2 mg/dL (ref 8.9–10.3)
CO2: 25 mmol/L (ref 22–32)
Chloride: 102 mmol/L (ref 101–111)
Creatinine, Ser: 1.02 mg/dL — ABNORMAL HIGH (ref 0.44–1.00)
GFR calc Af Amer: >60 mL/min (ref >60)
GLUCOSE: 106 mg/dL — AB (ref 65–99)
Potassium: 3.3 mmol/L — ABNORMAL LOW (ref 3.5–5.1)
Sodium: 136 mmol/L (ref 135–145)

## 2016-03-03 LAB — CBC WITH DIFFERENTIAL/PLATELET
BASOS ABS: 0 10*3/uL (ref 0.0–0.1)
BASOS PCT: 0 %
Eosinophils Absolute: 0.3 10*3/uL (ref 0.0–0.7)
Eosinophils Relative: 2 %
HEMATOCRIT: 43.3 % (ref 36.0–46.0)
HEMOGLOBIN: 14.7 g/dL (ref 12.0–15.0)
LYMPHS PCT: 18 %
Lymphs Abs: 2.4 10*3/uL (ref 0.7–4.0)
MCH: 30.6 pg (ref 26.0–34.0)
MCHC: 33.9 g/dL (ref 30.0–36.0)
MCV: 90 fL (ref 78.0–100.0)
MONO ABS: 1.1 10*3/uL — AB (ref 0.1–1.0)
MONOS PCT: 8 %
Neutro Abs: 9.8 10*3/uL — ABNORMAL HIGH (ref 1.7–7.7)
Neutrophils Relative %: 72 %
Platelets: 240 10*3/uL (ref 150–400)
RBC: 4.81 MIL/uL (ref 3.87–5.11)
RDW: 13.1 % (ref 11.5–15.5)
WBC: 13.5 10*3/uL — ABNORMAL HIGH (ref 4.0–10.5)

## 2016-03-03 MED ORDER — MORPHINE SULFATE (PF) 4 MG/ML IV SOLN
4.0000 mg | Freq: Once | INTRAVENOUS | Status: AC
  Administered 2016-03-03: 4 mg via INTRAVENOUS
  Filled 2016-03-03: qty 1

## 2016-03-03 MED ORDER — SODIUM CHLORIDE 0.9 % IV BOLUS (SEPSIS)
1000.0000 mL | Freq: Once | INTRAVENOUS | Status: AC
  Administered 2016-03-03: 1000 mL via INTRAVENOUS

## 2016-03-03 MED ORDER — ONDANSETRON HCL 4 MG/2ML IJ SOLN
4.0000 mg | Freq: Once | INTRAMUSCULAR | Status: AC
  Administered 2016-03-03: 4 mg via INTRAVENOUS
  Filled 2016-03-03: qty 2

## 2016-03-03 MED ORDER — DEXAMETHASONE SODIUM PHOSPHATE 10 MG/ML IJ SOLN
10.0000 mg | Freq: Once | INTRAMUSCULAR | Status: AC
  Administered 2016-03-03: 10 mg via INTRAVENOUS
  Filled 2016-03-03: qty 1

## 2016-03-03 MED ORDER — LIDOCAINE VISCOUS 2 % MT SOLN
15.0000 mL | OROMUCOSAL | Status: DC | PRN
  Administered 2016-03-03: 15 mL via OROMUCOSAL
  Filled 2016-03-03: qty 15

## 2016-03-03 NOTE — ED Provider Notes (Signed)
CSN: [id number]     Arrival date & time 03/03/16  1837 History   First MD Initiated Contact with Patient 03/03/16 2207     Chief Complaint  Patient presents with  . Post-op Problem     (Consider location/radiation/quality/duration/timing/severity/associated sxs/prior Treatment) The history is provided by the patient and a friend.     Patient is a 35 year old female with a history of anxiety, obesity, hypothyroidism, A. fib, who presents the ED with throat pain and inability to swallow for 3 days. Patient is s/p uvulopalatopharyngoplasty and tonsillectomy/septoplasty on 6/23. She was released from [facility] on 6/24. Patient is unable to speak due to pain so her friend Mel Almond gave me the history. The friend states patient is unable to keep her pain medicine, antibiotic, or any liquids down. She is unable to swallow due to pain. Patient's has not eaten or drank anything since she left the hospital on Saturday. She is afraid she is dehydrated. She states a fever today of 101.70F. She endorses a mild throbbing constant, 5/6, nonradiating headache. Patient denies abdominal pain, dysuria, hematuria, visual changes, chest pain, shortness of breath.  Past Medical History  Diagnosis Date  . Hyperlipidemia   . A-fib ([place])   . Hypothyroidism   . Asthma   . Multiple duodenal ulcers   . Sleep apnea     np cpap unable to use  . Shortness of breath dyspnea   . History of kidney stones   . Dysrhythmia     tachycardia post surgery shoulder 05,07 litho-cardizem given and  also with  14 asthma attack  . Complication of anesthesia     "woke up during surgery x2--one was rotator cuff, other was lithotrypsy, tarchycardia post anesthia   . Anxiety    Past Surgical History  Procedure Laterality Date  . Kidney stone removal  07    litho  . Rotator cuff repair Left 05  . Uvulectomy  02/29/2016    UVULOPALATOPHARYNGOPLASTY (UPPP) BILATERAL TONSILLECTOMY/SEPTOPLASTY WITH TURBINATE REDUCTION  (Bilateral)  . Uvulopalatopharyngoplasty (uppp)/tonsillectomy/septoplasty Bilateral 02/29/2016    Procedure: UVULOPALATOPHARYNGOPLASTY (UPPP) BILATERAL TONSILLECTOMY/SEPTOPLASTY WITH TURBINATE REDUCTION;  Surgeon: Rozetta Nunnery, MD;  Location: [facility];  Service: ENT;  Laterality: Bilateral;   No family history on file. Social History  Substance Use Topics  . Smoking status: Never Smoker   . Smokeless tobacco: Never Used  . Alcohol Use: No   OB History    No data available     Review of Systems  Constitutional: Positive for fever and chills. Negative for diaphoresis.  HENT: Positive for trouble swallowing and voice change.   Eyes: Negative for visual disturbance.  Respiratory: Negative for shortness of breath.   Cardiovascular: Negative for chest pain.  Gastrointestinal: Positive for vomiting. Negative for nausea, abdominal pain and diarrhea.  Genitourinary: Negative for dysuria and hematuria.  Musculoskeletal: Negative for back pain and neck pain.  Skin: Negative for rash.  Neurological: Positive for headaches. Negative for dizziness, syncope, weakness and numbness.      Allergies  Albuterol; Codeine; and Penicillins  Home Medications   Prior to Admission medications   Medication Sig Start Date End Date Taking? Authorizing Provider  atorvastatin (LIPITOR) 10 MG tablet Take 10 mg by mouth daily.    Historical Provider, MD  azithromycin (ZITHROMAX) 200 MG/5ML suspension Take 5 mLs (200 mg total) by mouth daily. 03/01/16 03/07/16  Rozetta Nunnery, MD  Cyanocobalamin (B-12) 5000 MCG SUBL Place 1 tablet under the tongue daily.    Historical Provider,  MD  HYDROcodone-acetaminophen (HYCET) 7.5-325 mg/15 ml solution Take 10-15 mLs by mouth every 4 (four) hours as needed for moderate pain. 03/01/16 03/01/17  Rozetta Nunnery, MD  levalbuterol [facility] HFA) 45 MCG/ACT inhaler Inhale 1 puff into the lungs every 4 (four) hours as needed for wheezing.    Historical Provider, MD   levothyroxine (SYNTHROID, LEVOTHROID) 200 MCG tablet Take 200 mcg by mouth daily before breakfast. Pt takes 250 mcg daily    Historical Provider, MD  levothyroxine (SYNTHROID, LEVOTHROID) 50 MCG tablet Take 50 mcg by mouth daily before breakfast. Pt takes 250 mcg daily    Historical Provider, MD  loratadine (CLARITIN) 10 MG tablet Take 10 mg by mouth daily.    Historical Provider, MD  ondansetron (ZOFRAN ODT) 8 MG disintegrating tablet Take 1 tablet (8 mg total) by mouth every 8 (eight) hours as needed for nausea or vomiting. 03/01/16   Rozetta Nunnery, MD  oxyCODONE (ROXICODONE) 5 MG/5ML solution Take 5 mLs (5 mg total) by mouth every 4 (four) hours as needed for severe pain. 03/01/16   Rozetta Nunnery, MD  pantoprazole (PROTONIX) 40 MG tablet Take 40 mg by mouth 2 (two) times daily.    Historical Provider, MD  propranolol (INDERAL) 40 MG tablet Take 40 mg by mouth 2 (two) times daily.    Historical Provider, MD  Vitamin D, Ergocalciferol, (DRISDOL) 50000 units CAPS capsule Take 50,000 Units by mouth every Monday.    Historical Provider, MD   BP 120/98 mmHg  Pulse 115  Temp(Src) 99.2 F (37.3 C) (Axillary)  Resp 16  SpO2 99%  LMP 02/20/2016 Physical Exam  Constitutional: She appears well-developed and well-nourished. No distress.  HENT:  Head: Normocephalic and atraumatic.  Eschar and exudate covered the posterior oropharynx, mild erythema surrounding the exudate and eschar, no edema noted, patient with trismus, no sublingual tenderness or swelling,  Eyes: Conjunctivae are normal.  Neck: Normal range of motion. No tracheal deviation present.  Patient tender to palpation of the anterior neck, no cervical lymphadenopathy appreciated  Cardiovascular: Regular rhythm and normal heart sounds.  Tachycardia present.  Exam reveals no gallop and no friction rub.   No murmur heard. Pulmonary/Chest: Effort normal and breath sounds normal. No accessory muscle usage. No respiratory distress.   Abdominal: Soft. There is no tenderness.  Neurological: She is alert. Coordination normal.  Skin: Skin is warm and dry. No rash noted.  Psychiatric: She has a normal mood and affect. Her behavior is normal.  Nursing note and vitals reviewed.   ED Course  Procedures (including critical care time)  12:00am Patient states her pain is improved, she is now speaking. She states she still nauseated. She states alternating Phenergan and Zofran usually helps her nausea. Patient states she does not want to stay overnight in the ER.    Labs Review Labs Reviewed  BASIC METABOLIC PANEL - Abnormal; Notable for the following:    Potassium 3.3 (*)    Glucose, Bld 106 (*)    Creatinine, Ser 1.02 (*)    All other components within normal limits  CBC WITH DIFFERENTIAL/PLATELET - Abnormal; Notable for the following:    WBC 13.5 (*)    Neutro Abs 9.8 (*)    Monocytes Absolute 1.1 (*)    All other components within normal limits    Imaging Review No results found. I have personally reviewed and evaluated these images and lab results as part of my medical decision-making.   EKG Interpretation None  MDM   Final diagnoses:  None   Pt s/p uvulopalatopharyngoplasty and tonsillectomy/septoplasty on 6/23 with pain and unable to tolerate PO for 2 days. Pt was tachycardic with elevated WBC on arrival. Subjective fever, afebrile in the ED.   Pt given steroids, pain meds, antiemetics, azithromycin IV, 2L of fluids while in the ED. Vital signs improved. Pain improved. Pt met SIRS criteria upon arrival to the ED but she is well appearing, not acutely ill, no concerns for sepsis, vital signs improved with fluids and she was given antibiotics. Pt without trismus or sublingual tenderness or swelling so less concerning for ludwigs angina. No indication for imaging at this time.   Will do a PO challenge and if the pt can tolerate PO then she will be discharged home with strict f/u with her PCP and her  ENT.  PO challenge pending.  Pt care signed out to Sheri Upstill, PA-C at change of shift.   Case discussed and pt seen by Dr. Floyd who agrees with the above plan.       Jessica L Focht, PA 03/04/16 0127  Dan Floyd, DO 03/04/16 1512 

## 2016-03-03 NOTE — ED Notes (Signed)
Pt. reports persistent nausea and emesis post nasal surgery/tonsillectomy last Friday by Dr. Ezzard StandingNewman unrelieved by prescription Zofran , no bleeding , airway intact /respirations unlabored . Denies fever or chills.

## 2016-03-03 NOTE — ED Notes (Signed)
PA-C at bedside, assessing patient.

## 2016-03-04 MED ORDER — PROMETHAZINE HCL 25 MG/ML IJ SOLN
12.5000 mg | Freq: Once | INTRAMUSCULAR | Status: AC
  Administered 2016-03-04: 12.5 mg via INTRAVENOUS
  Filled 2016-03-04: qty 1

## 2016-03-04 MED ORDER — PROMETHAZINE HCL 25 MG RE SUPP: 25.0000 mg | Freq: Four times a day (QID) | RECTAL | Status: AC | PRN

## 2016-03-04 MED ORDER — KETOROLAC TROMETHAMINE 15 MG/ML IJ SOLN
15.0000 mg | Freq: Once | INTRAMUSCULAR | Status: AC
  Administered 2016-03-04: 15 mg via INTRAVENOUS
  Filled 2016-03-04: qty 1

## 2016-03-04 MED ORDER — DEXTROSE 5 % IV SOLN
500.0000 mg | Freq: Once | INTRAVENOUS | Status: AC
  Administered 2016-03-04: 500 mg via INTRAVENOUS
  Filled 2016-03-04: qty 500

## 2016-03-04 MED ORDER — SODIUM CHLORIDE 0.9 % IV BOLUS (SEPSIS)
500.0000 mL | Freq: Once | INTRAVENOUS | Status: AC
  Administered 2016-03-04: 500 mL via INTRAVENOUS

## 2016-03-04 NOTE — ED Notes (Signed)
Patient verbalized understanding of discharge instructions and denies any further needs or questions at this time. VS stable. Patient ambulatory with steady gait, declined wheelchair.  

## 2016-03-04 NOTE — ED Notes (Signed)
Pt ambulated to the restroom. Gait steady and even. 

## 2016-03-04 NOTE — Discharge Instructions (Signed)
CONTACT DR. Ezzard StandingNEWMAN TO DISCUSS YOUR EMERGENCY DEPARTMENT TREATMENT. CONTINUE YOUR CURRENT MEDICATIONS. PHENERGAN HAS BEEN PRESCRIBED FOR BETTER CONTROL OF NAUSEA. PUSH FLUIDS TO AVOID DEHYDRATION.

## 2016-03-04 NOTE — ED Provider Notes (Signed)
Throat surgery on 02/29/16 Ezzard Standing- Newman Here with post-op pain, fever today, unable to eat or drink secondary to pain IVF's provided Zofran, phenergan, lidocaine gargle, morphine - pain still uncontrolled PO challenge now (12:50)  2:00 - patient sipping water. She is receiving IV zithromax with plan to discharge home afterward. VSS, improved since arrival.   3:00 - Patient sitting up in bed reporting she feels significantly better. She is drinking PO fluids. IV Zithromax is complete. Stable for discharge home.   Elpidio AnisShari Aradhya Shellenbarger, PA-C 03/04/16 [id number]  Azalia BilisKevin Campos, MD 03/04/16 [phone]

## 2016-03-15 NOTE — Discharge Summary (Deleted)
[name]:  [name], Sara                  ACCOUNT NO.:  [id number]  MEDICAL RECORD NO.:  [id number]  LOCATION:                               FACILITY:  [facility]  PHYSICIAN:  Kristine GarbeChristopher E. Ezzard StandingNewman, M.D.DATE OF BIRTH:  01/24/1981  DATE OF ADMISSION:  02/29/2016 DATE OF DISCHARGE:  03/01/2016                              DISCHARGE SUMMARY   PREOPERATIVE DIAGNOSIS:  Obstructive sleep apnea.  OPERATIONS DURING THIS HOSPITALIZATION:  Septoplasty, bilateral inferior turbinate reductions, and uvulopalatopharyngoplasty with tonsillectomy on February 29, 2016.  The patient was admitted on February 29, 2016, discharged on March 01, 2016.  HOSPITAL COURSE:  The patient was admitted via the operating room on February 29, 2016, at which time, she underwent a septoplasty and turbinate reductions to help with chronic nasal obstruction, and uvulopalatopharyngoplasty with tonsillectomy because of significant history of obstructive sleep apnea and not able to use nasal CPAP well. She tolerated the surgery well, was subsequently admitted to step-down unit postoperatively, received perioperative antibiotic Ancef, and observed for any respiratory problems.  She had no real respiratory difficulty.  Her nasal packing was removed on her first postoperative day.  She was able to drink liquids adequately with moderate amount of pain and was subsequently discharged home on her first postoperative day after the packing was removed.  She was discharged home on regular medications including oxycodone elixir as well as hydrocodone elixir. Switch her to Zithromax suspension 200 mg a day for 6 days.  We will have her follow up in my office in 10 to 14 days for recheck.          ______________________________ Kristine Garbehristopher E. Ezzard StandingNewman, M.D.     CEN/MEDQ  D:  03/14/2016  T:  03/15/2016  Job:  [id number]

## 2016-03-15 NOTE — Discharge Summary (Signed)
NAME:  Sara [name], Sara [name]                  ACCOUNT NO.:  [id number]  MEDICAL RECORD NO.:  [id number]  LOCATION:                               FACILITY:  [facility]  PHYSICIAN:  Kensleigh Gates E. Baden Betsch, M.D.DATE OF BIRTH:  04/18/1981  DATE OF ADMISSION:  02/29/2016 DATE OF DISCHARGE:  03/01/2016                              DISCHARGE SUMMARY   PREOPERATIVE DIAGNOSIS:  Obstructive sleep apnea.  OPERATIONS DURING THIS HOSPITALIZATION:  Septoplasty, bilateral inferior turbinate reductions, and uvulopalatopharyngoplasty with tonsillectomy on February 29, 2016.  The patient was admitted on February 29, 2016, discharged on March 01, 2016.  HOSPITAL COURSE:  The patient was admitted via the operating room on February 29, 2016, at which time, she underwent a septoplasty and turbinate reductions to help with chronic nasal obstruction, and uvulopalatopharyngoplasty with tonsillectomy because of significant history of obstructive sleep apnea and not able to use nasal CPAP well. She tolerated the surgery well, was subsequently admitted to step-down unit postoperatively, received perioperative antibiotic Ancef, and observed for any respiratory problems.  She had no real respiratory difficulty.  Her nasal packing was removed on her first postoperative day.  She was able to drink liquids adequately with moderate amount of pain and was subsequently discharged home on her first postoperative day after the packing was removed.  She was discharged home on regular medications including oxycodone elixir as well as hydrocodone elixir. Switch her to Zithromax suspension 200 mg a day for 6 days.  We will have her follow up in my office in 10 to 14 days for recheck.          ______________________________ Breah Joa E. Haiven Nardone, M.D.     CEN/MEDQ  D:  03/14/2016  T:  03/15/2016  Job:  [id number] 

## 2017-05-11 IMAGING — CR DG CHEST 2V
2 series · 2 of 2 positions shown · non-contrast
Comparison: None.

CLINICAL DATA: Initial evaluation for acute right-sided chest pain
and shortness of breath. History of atrial fibrillation.

EXAM:
CHEST  2 VIEW

[chest pa]
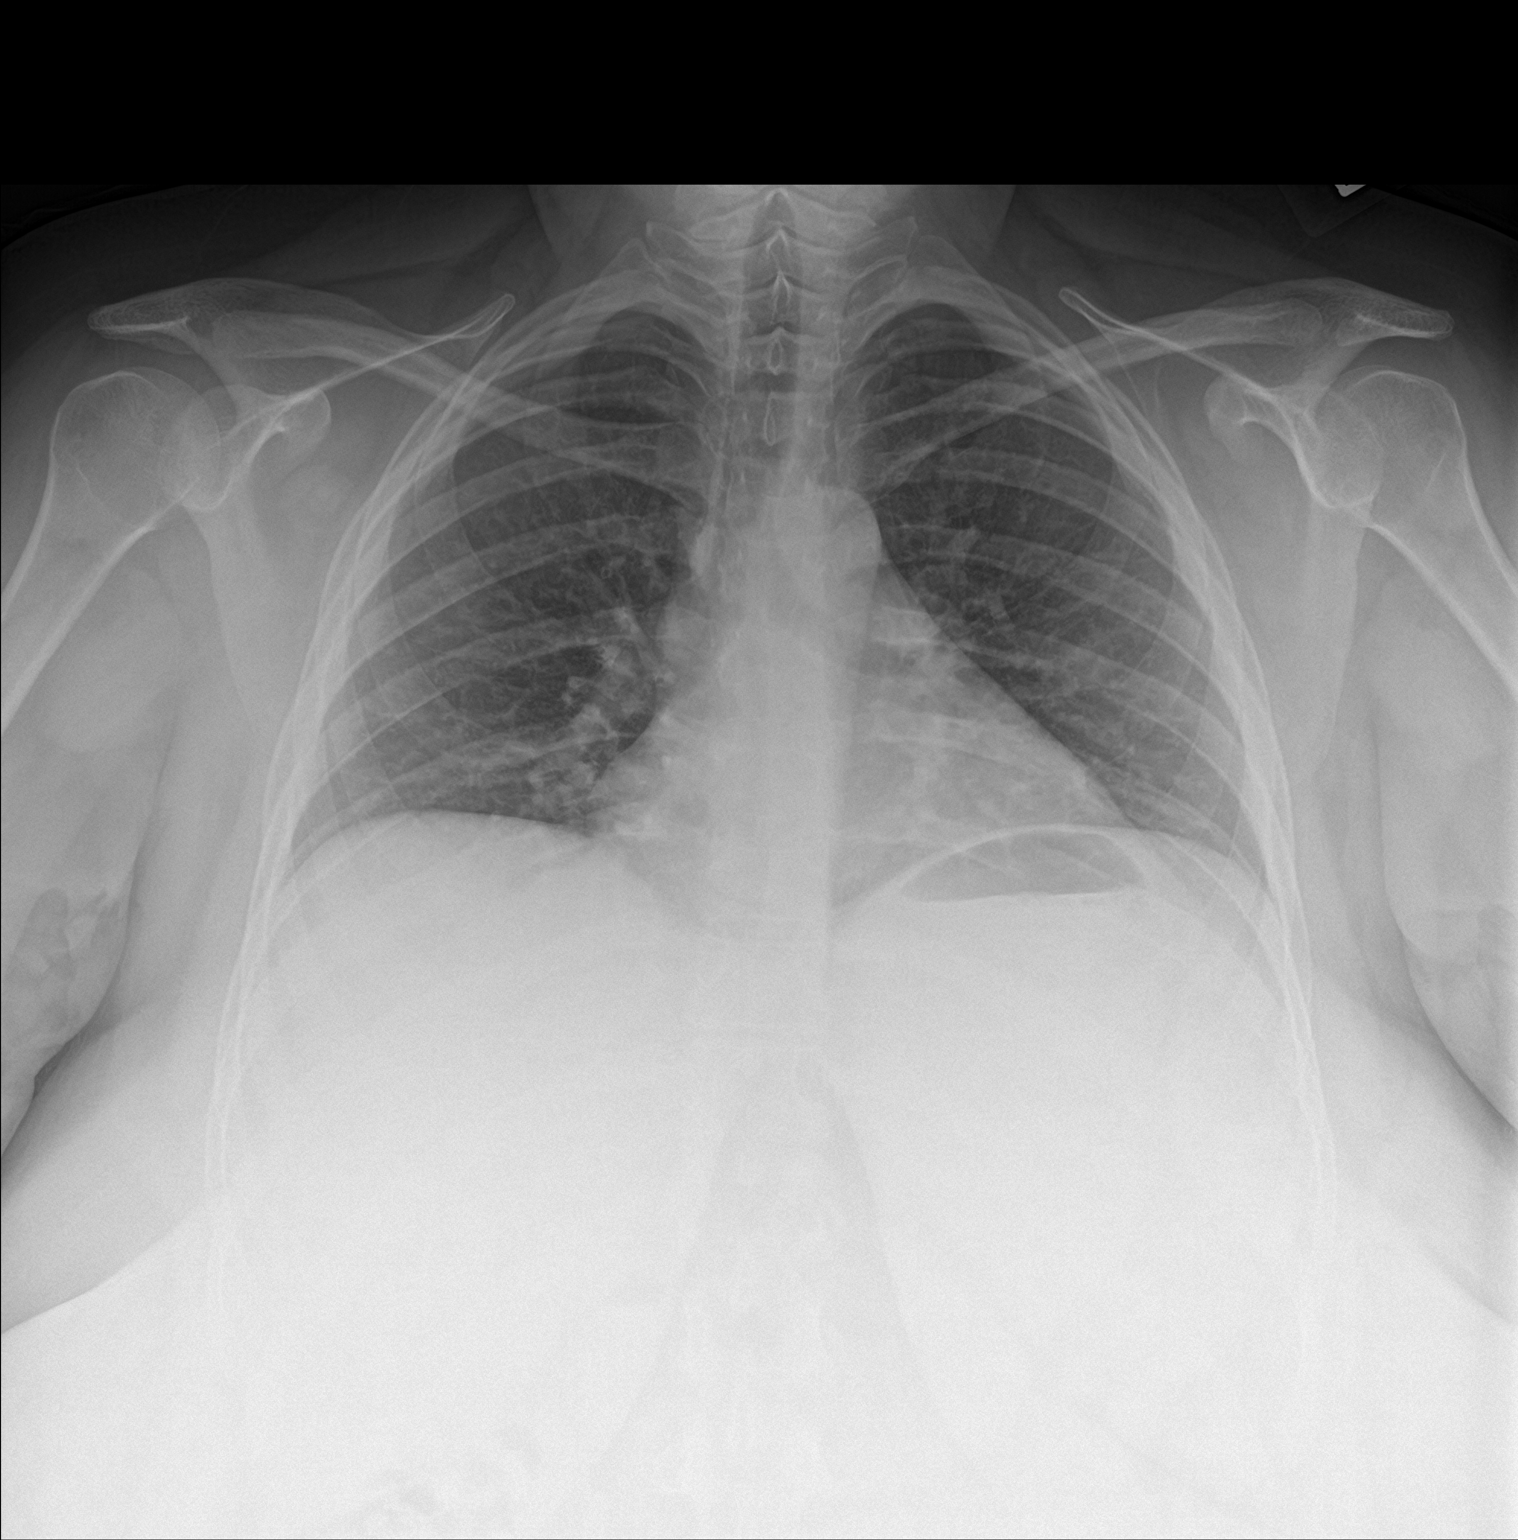

[chest lat]
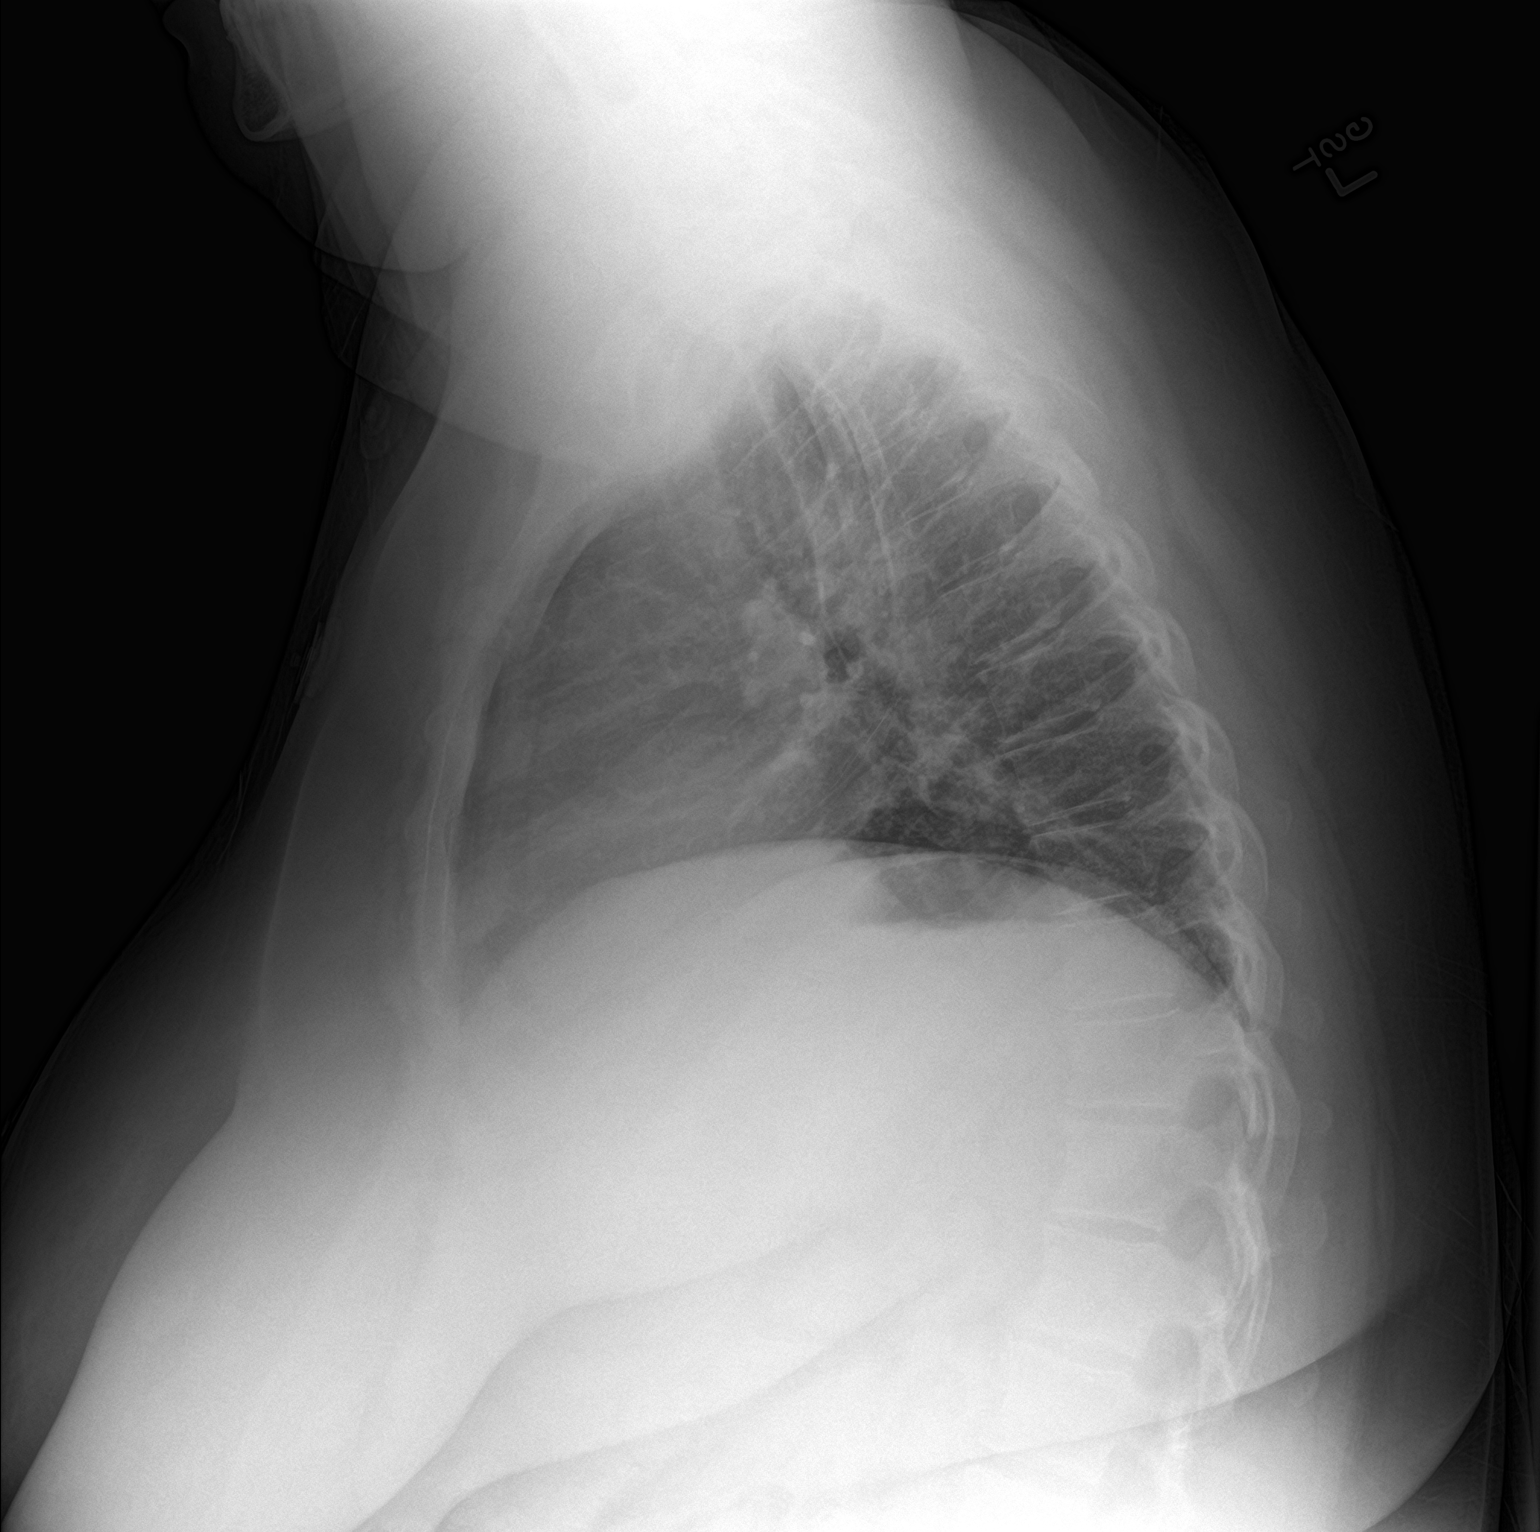

[2 of 2 positions shown; findings below may reference images not displayed]

FINDINGS: Cardiac and mediastinal silhouettes are within normal limits. Trach
air column midline and patent.

Lungs are hypoinflated. Secondary diffuse vascular
congestion/bronchovascular crowding. No focal infiltrates
identified. No pulmonary edema or pleural effusion. No pneumothorax.

No acute osseous abnormality.
IMPRESSION: Shallow lung inflation with secondary diffuse vascular congestion/
bronchovascular crowding. No other active cardiopulmonary disease.

## 2017-05-20 IMAGING — CT CT PARANASAL SINUSES LIMITED
1 series · 13 of 15 positions shown, 17 images · non-contrast
Comparison: None.

CLINICAL DATA: Recurrent sinusitis

EXAM:
CT PARANASAL SINUS LIMITED WITHOUT CONTRAST
TECHNIQUE: Non-contiguous multidetector CT images of the paranasal sinuses were
obtained in a single plane without contrast.

[Series 4: soft tissue · axial · 0.29mm/px · z∈[-72,+48]mm · 13 of 15 slices shown, 17 images]
[im 2/15  brain]
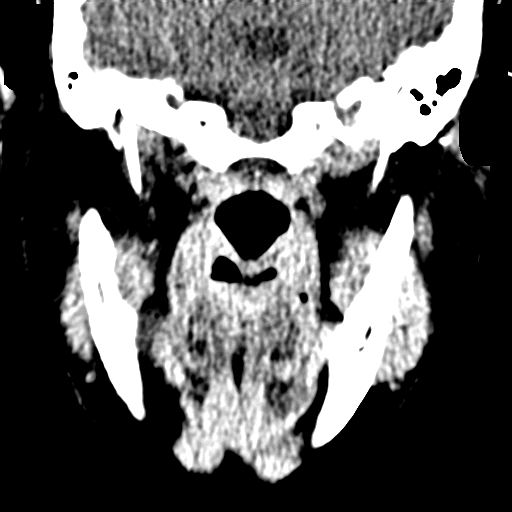
[im 2/15  bone]
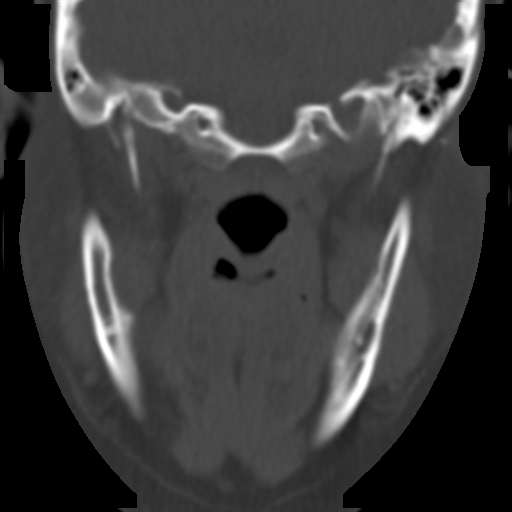
[im 3/15  bone]
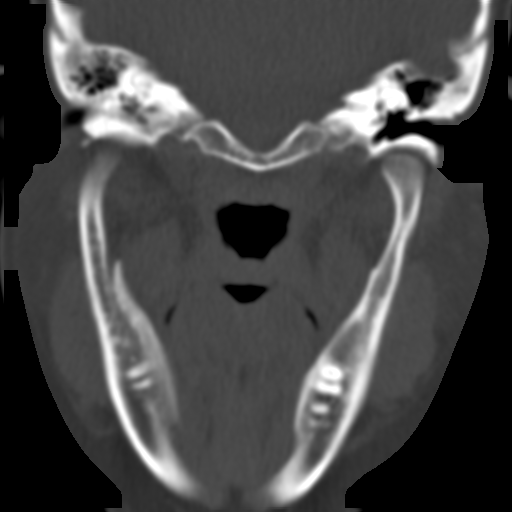
[im 4/15  bone]
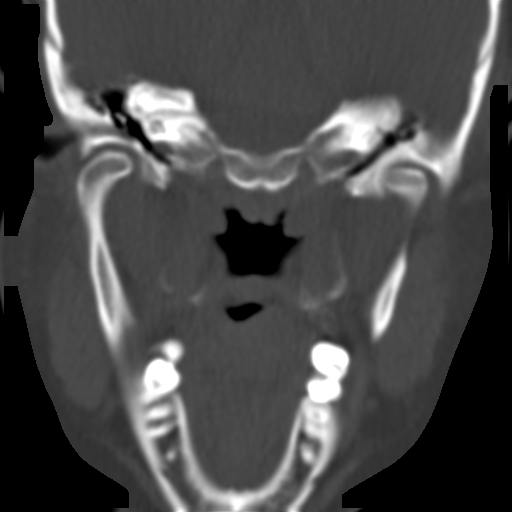
[im 5/15  bone]
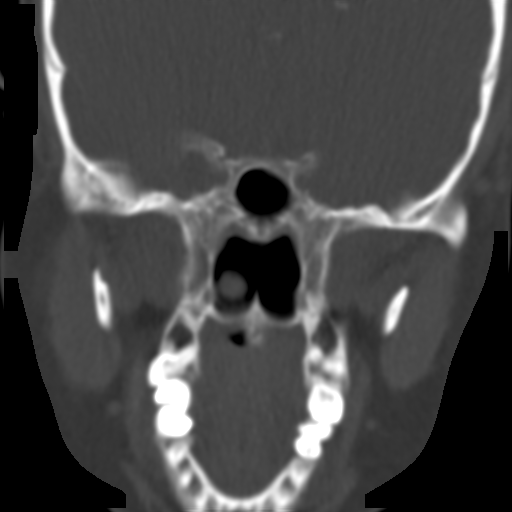
[im 6/15  brain]
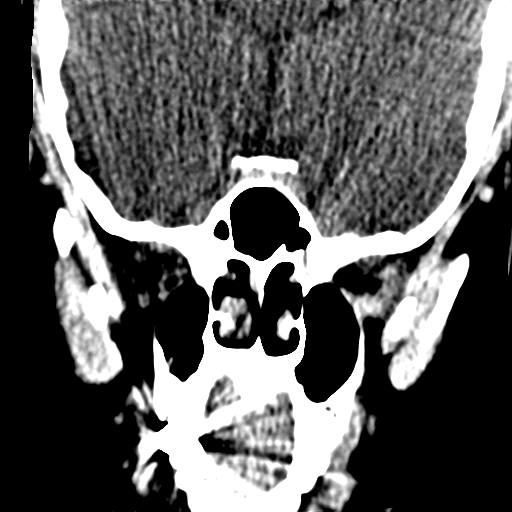
[im 6/15  bone]
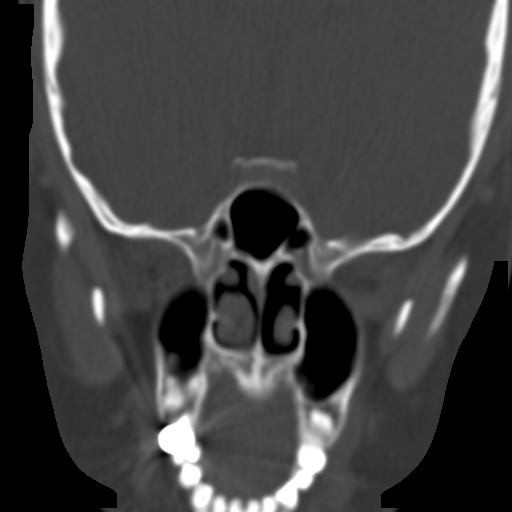
[im 7/15  bone]
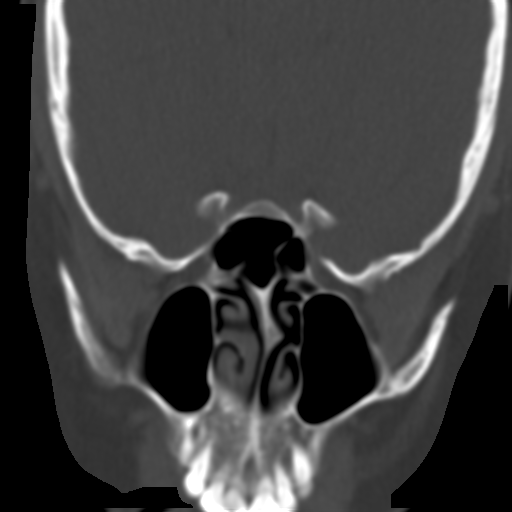
[im 8/15  bone]
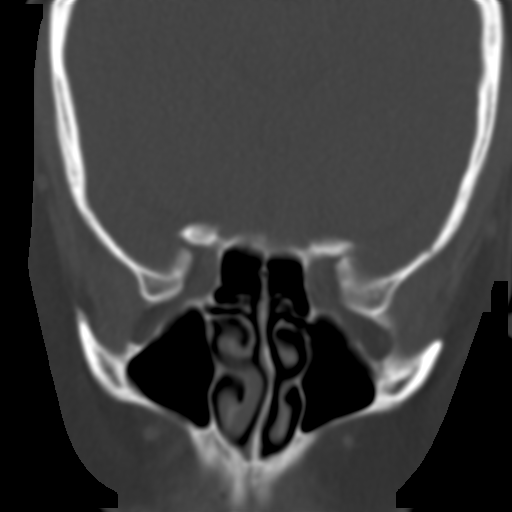
[im 9/15  bone]
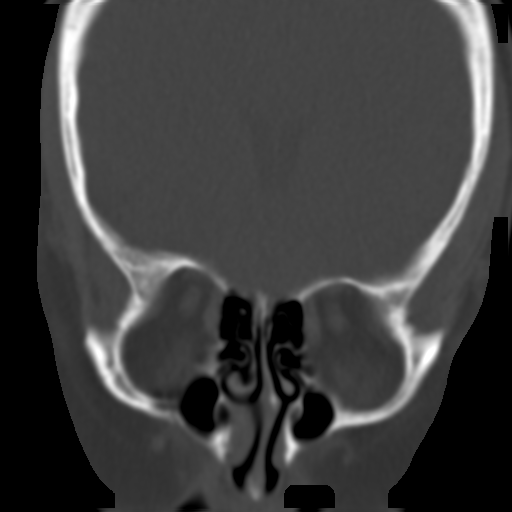
[im 10/15  brain]
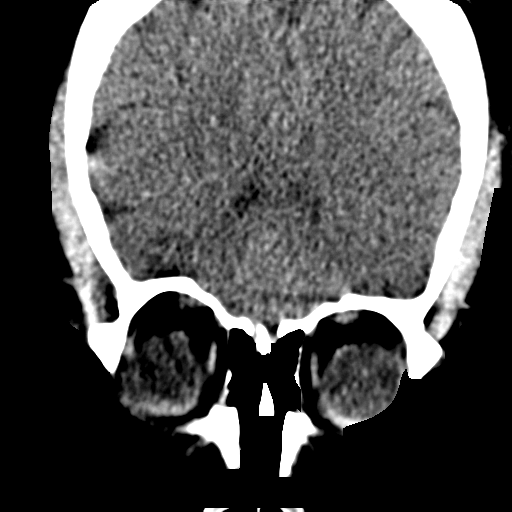
[im 10/15  bone]
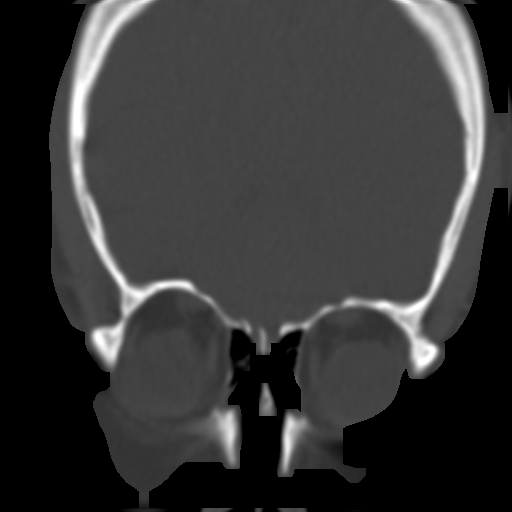
[im 11/15  bone]
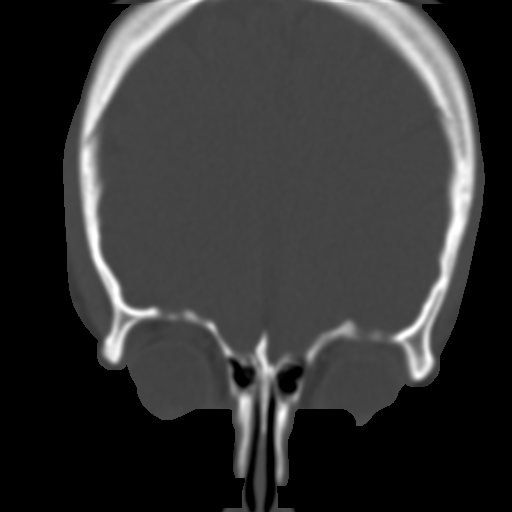
[im 12/15  bone]
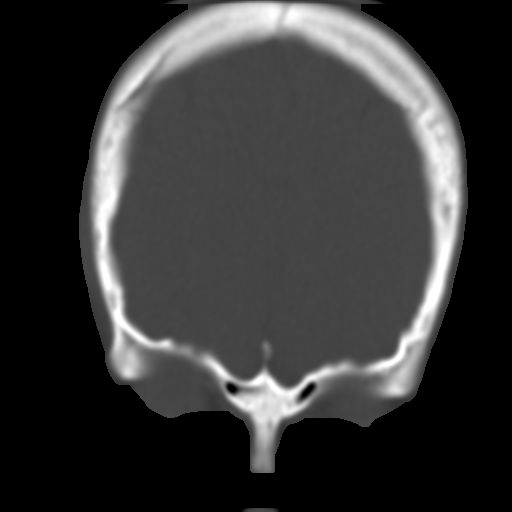
[im 13/15  bone]
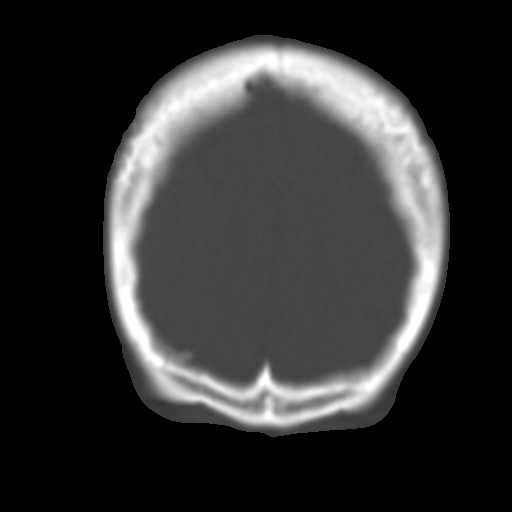
[im 14/15  brain]
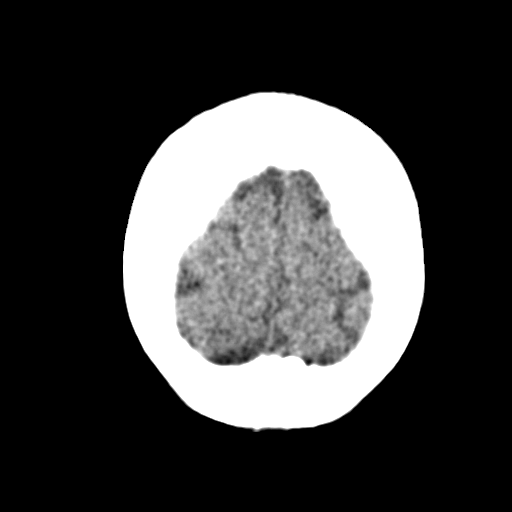
[im 14/15  bone]
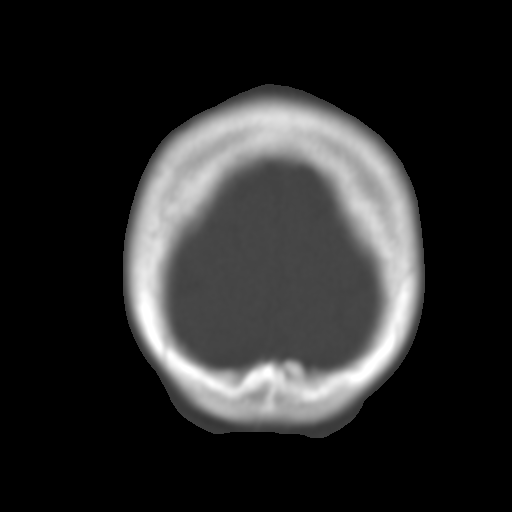

[13 of 15 positions shown; findings below may reference images not displayed]

FINDINGS: Paranasal sinuses clear. No mucosal edema or air-fluid level.
Frontal sinuses hypoplastic.

Regional soft tissues intact.
IMPRESSION: Negative
